# Patient Record
Sex: Male | Born: 1960 | Race: White | Hispanic: No | Marital: Married | Smoking: Former smoker
Health system: Southern US, Community
[De-identification: ages and names within clinical notes are randomized; demographics above are authoritative.]

## PROBLEM LIST (undated history)

## (undated) VITALS — BP 120/78 | HR 105 | Ht 69.0 in | Wt 208.4 lb

## (undated) DIAGNOSIS — I493 Ventricular premature depolarization: Secondary | ICD-10-CM

## (undated) DIAGNOSIS — M5030 Other cervical disc degeneration, unspecified cervical region: Secondary | ICD-10-CM

## (undated) DIAGNOSIS — G4733 Obstructive sleep apnea (adult) (pediatric): Secondary | ICD-10-CM

## (undated) DIAGNOSIS — R0602 Shortness of breath: Secondary | ICD-10-CM

## (undated) DIAGNOSIS — E119 Type 2 diabetes mellitus without complications: Secondary | ICD-10-CM

## (undated) DIAGNOSIS — E785 Hyperlipidemia, unspecified: Secondary | ICD-10-CM

## (undated) DIAGNOSIS — G5600 Carpal tunnel syndrome, unspecified upper limb: Secondary | ICD-10-CM

## (undated) DIAGNOSIS — R079 Chest pain, unspecified: Secondary | ICD-10-CM

## (undated) HISTORY — DX: Obstructive sleep apnea (adult) (pediatric): G47.33

## (undated) HISTORY — DX: Ventricular premature depolarization: I49.3

## (undated) HISTORY — DX: Hyperlipidemia, unspecified: E78.5

## (undated) HISTORY — DX: Shortness of breath: R06.02

## (undated) HISTORY — DX: Chest pain, unspecified: R07.9

## (undated) HISTORY — DX: Type 2 diabetes mellitus without complications: E11.9

## (undated) HISTORY — DX: Carpal tunnel syndrome, unspecified upper limb: G56.00

## (undated) HISTORY — DX: Other cervical disc degeneration, unspecified cervical region: M50.30

---

## 2005-03-05 ENCOUNTER — Emergency Department (HOSPITAL_COMMUNITY): Admission: EM | Admit: 2005-03-05 | Discharge: 2005-03-05 | Payer: Self-pay | Admitting: Emergency Medicine

## 2005-03-21 ENCOUNTER — Ambulatory Visit (HOSPITAL_COMMUNITY): Admission: RE | Admit: 2005-03-21 | Discharge: 2005-03-21 | Payer: Self-pay | Admitting: Urology

## 2005-03-26 ENCOUNTER — Encounter (INDEPENDENT_AMBULATORY_CARE_PROVIDER_SITE_OTHER): Payer: Self-pay | Admitting: Specialist

## 2005-03-26 ENCOUNTER — Ambulatory Visit (HOSPITAL_COMMUNITY): Admission: RE | Admit: 2005-03-26 | Discharge: 2005-03-26 | Payer: Self-pay | Admitting: Urology

## 2006-03-27 HISTORY — PX: HERNIA REPAIR: SHX51

## 2006-06-11 ENCOUNTER — Ambulatory Visit (HOSPITAL_BASED_OUTPATIENT_CLINIC_OR_DEPARTMENT_OTHER): Admission: RE | Admit: 2006-06-11 | Discharge: 2006-06-11 | Payer: Self-pay | Admitting: Family Medicine

## 2006-06-14 ENCOUNTER — Ambulatory Visit: Payer: Self-pay | Admitting: Internal Medicine

## 2006-11-25 HISTORY — PX: NECK SURGERY: SHX720

## 2007-09-16 ENCOUNTER — Ambulatory Visit: Payer: Self-pay | Admitting: Internal Medicine

## 2007-09-22 ENCOUNTER — Ambulatory Visit: Payer: Self-pay | Admitting: Internal Medicine

## 2007-09-22 ENCOUNTER — Encounter: Payer: Self-pay | Admitting: Internal Medicine

## 2008-02-02 ENCOUNTER — Ambulatory Visit (HOSPITAL_COMMUNITY): Admission: RE | Admit: 2008-02-02 | Discharge: 2008-02-03 | Payer: Self-pay | Admitting: Neurosurgery

## 2009-07-27 ENCOUNTER — Emergency Department (HOSPITAL_COMMUNITY): Admission: EM | Admit: 2009-07-27 | Discharge: 2009-07-27 | Payer: Self-pay | Admitting: Emergency Medicine

## 2009-07-27 DIAGNOSIS — G5600 Carpal tunnel syndrome, unspecified upper limb: Secondary | ICD-10-CM | POA: Insufficient documentation

## 2009-07-27 DIAGNOSIS — R05 Cough: Secondary | ICD-10-CM

## 2009-07-27 DIAGNOSIS — M5030 Other cervical disc degeneration, unspecified cervical region: Secondary | ICD-10-CM | POA: Insufficient documentation

## 2009-07-27 DIAGNOSIS — G4733 Obstructive sleep apnea (adult) (pediatric): Secondary | ICD-10-CM | POA: Insufficient documentation

## 2009-07-27 DIAGNOSIS — J209 Acute bronchitis, unspecified: Secondary | ICD-10-CM | POA: Insufficient documentation

## 2009-07-27 DIAGNOSIS — R059 Cough, unspecified: Secondary | ICD-10-CM | POA: Insufficient documentation

## 2009-07-30 ENCOUNTER — Ambulatory Visit: Payer: Self-pay | Admitting: Pulmonary Disease

## 2009-07-30 DIAGNOSIS — J440 Chronic obstructive pulmonary disease with acute lower respiratory infection: Secondary | ICD-10-CM | POA: Insufficient documentation

## 2009-08-23 ENCOUNTER — Ambulatory Visit: Payer: Self-pay | Admitting: Pulmonary Disease

## 2009-09-20 ENCOUNTER — Encounter: Admission: RE | Admit: 2009-09-20 | Discharge: 2009-09-20 | Payer: Self-pay | Admitting: Neurosurgery

## 2009-10-16 ENCOUNTER — Ambulatory Visit (HOSPITAL_COMMUNITY): Admission: RE | Admit: 2009-10-16 | Discharge: 2009-10-16 | Payer: Self-pay | Admitting: Neurosurgery

## 2009-10-25 HISTORY — PX: BACK SURGERY: SHX140

## 2009-11-26 IMAGING — CR DG CERVICAL SPINE 2 OR 3 VIEWS
1 series · 1 of 1 positions shown · non-contrast
Comparison: None

CLINICAL DATA: C4-C6 fusion

CERVICAL SPINE - 2-3 VIEW

[view not recorded]
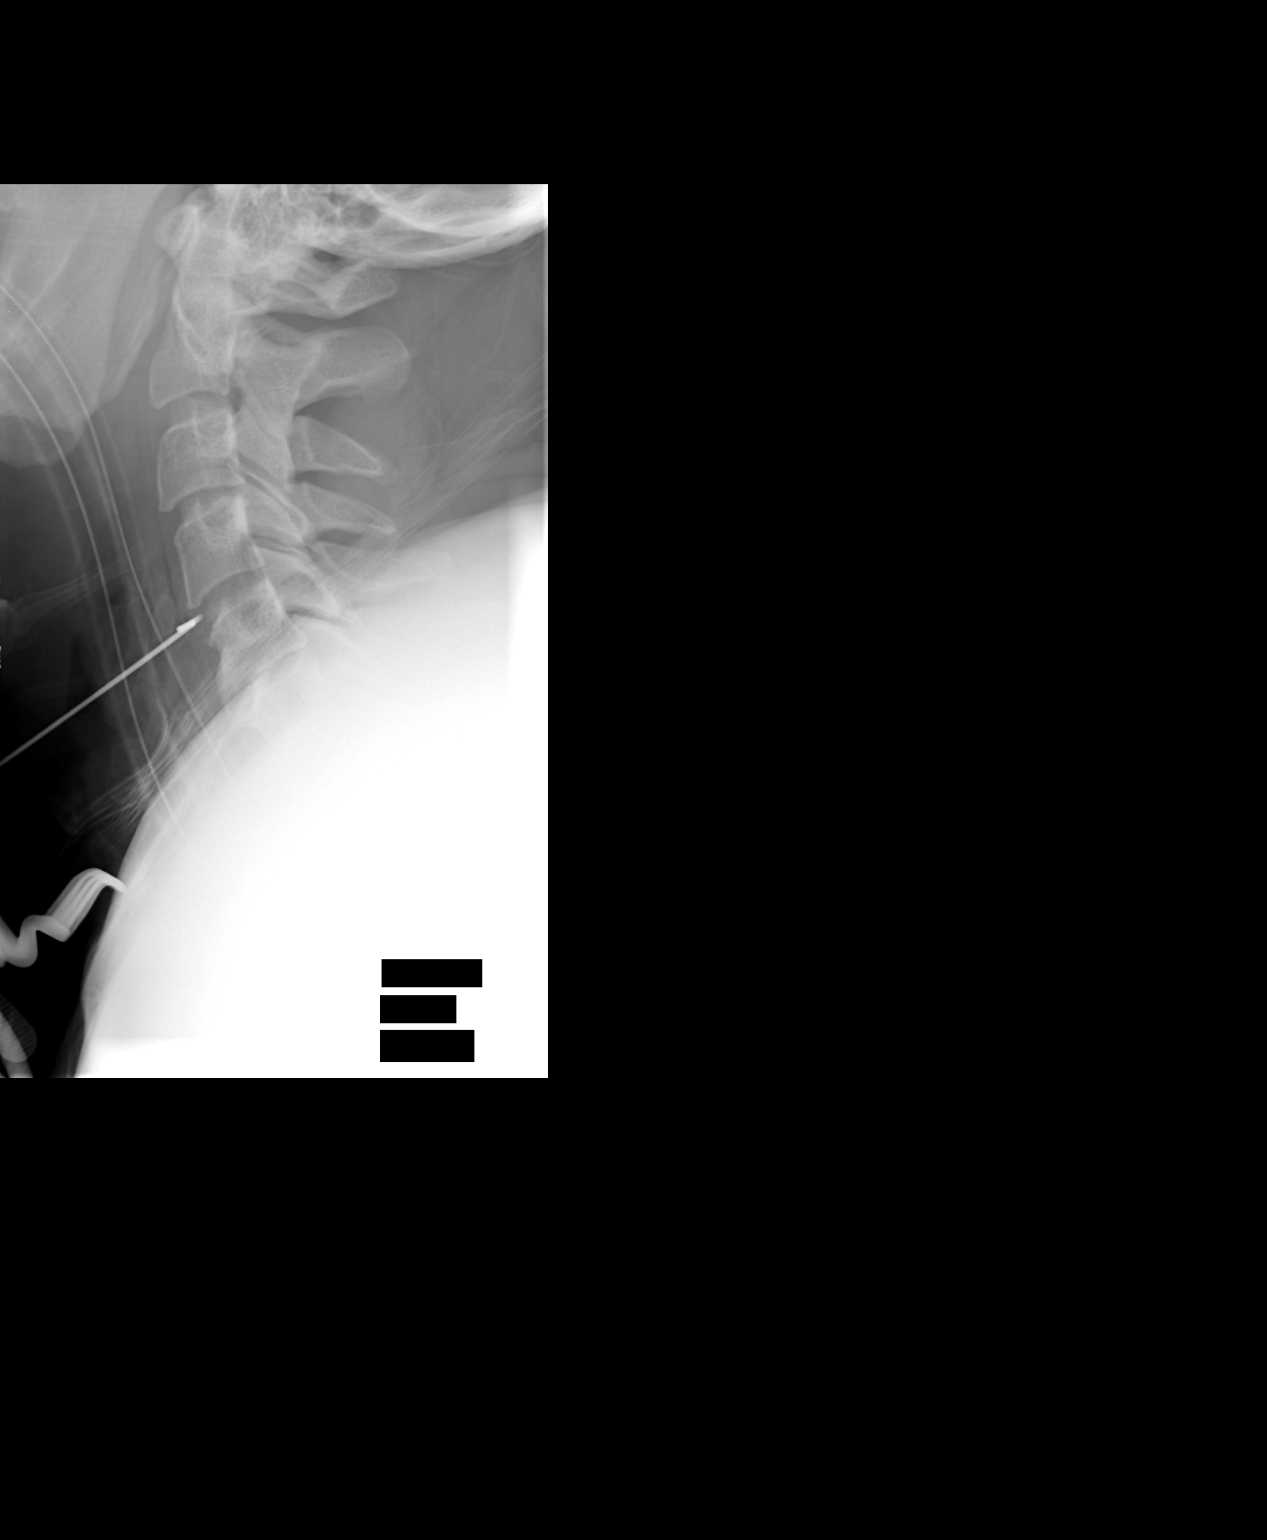

[1 of 1 positions shown; findings below may reference images not displayed]

FINDINGS: An initial film shows a needle at the anterior C4-5 disc
space.  The second film shows anterior cervical discectomy and
fusion from C4-C6.  Interbody fusion material is in place.  There
is an anterior plate with screw fixation.  Sponges are in place
along the operative approach.
IMPRESSION: ACDF C4-C6

## 2009-11-26 IMAGING — CR DG CHEST 2V
2 series · 2 of 2 positions shown · non-contrast
Comparison: None

CLINICAL DATA: Preop.  Cough and bronchitis.

CHEST - 2 VIEW

[view not recorded (1 of 2)]
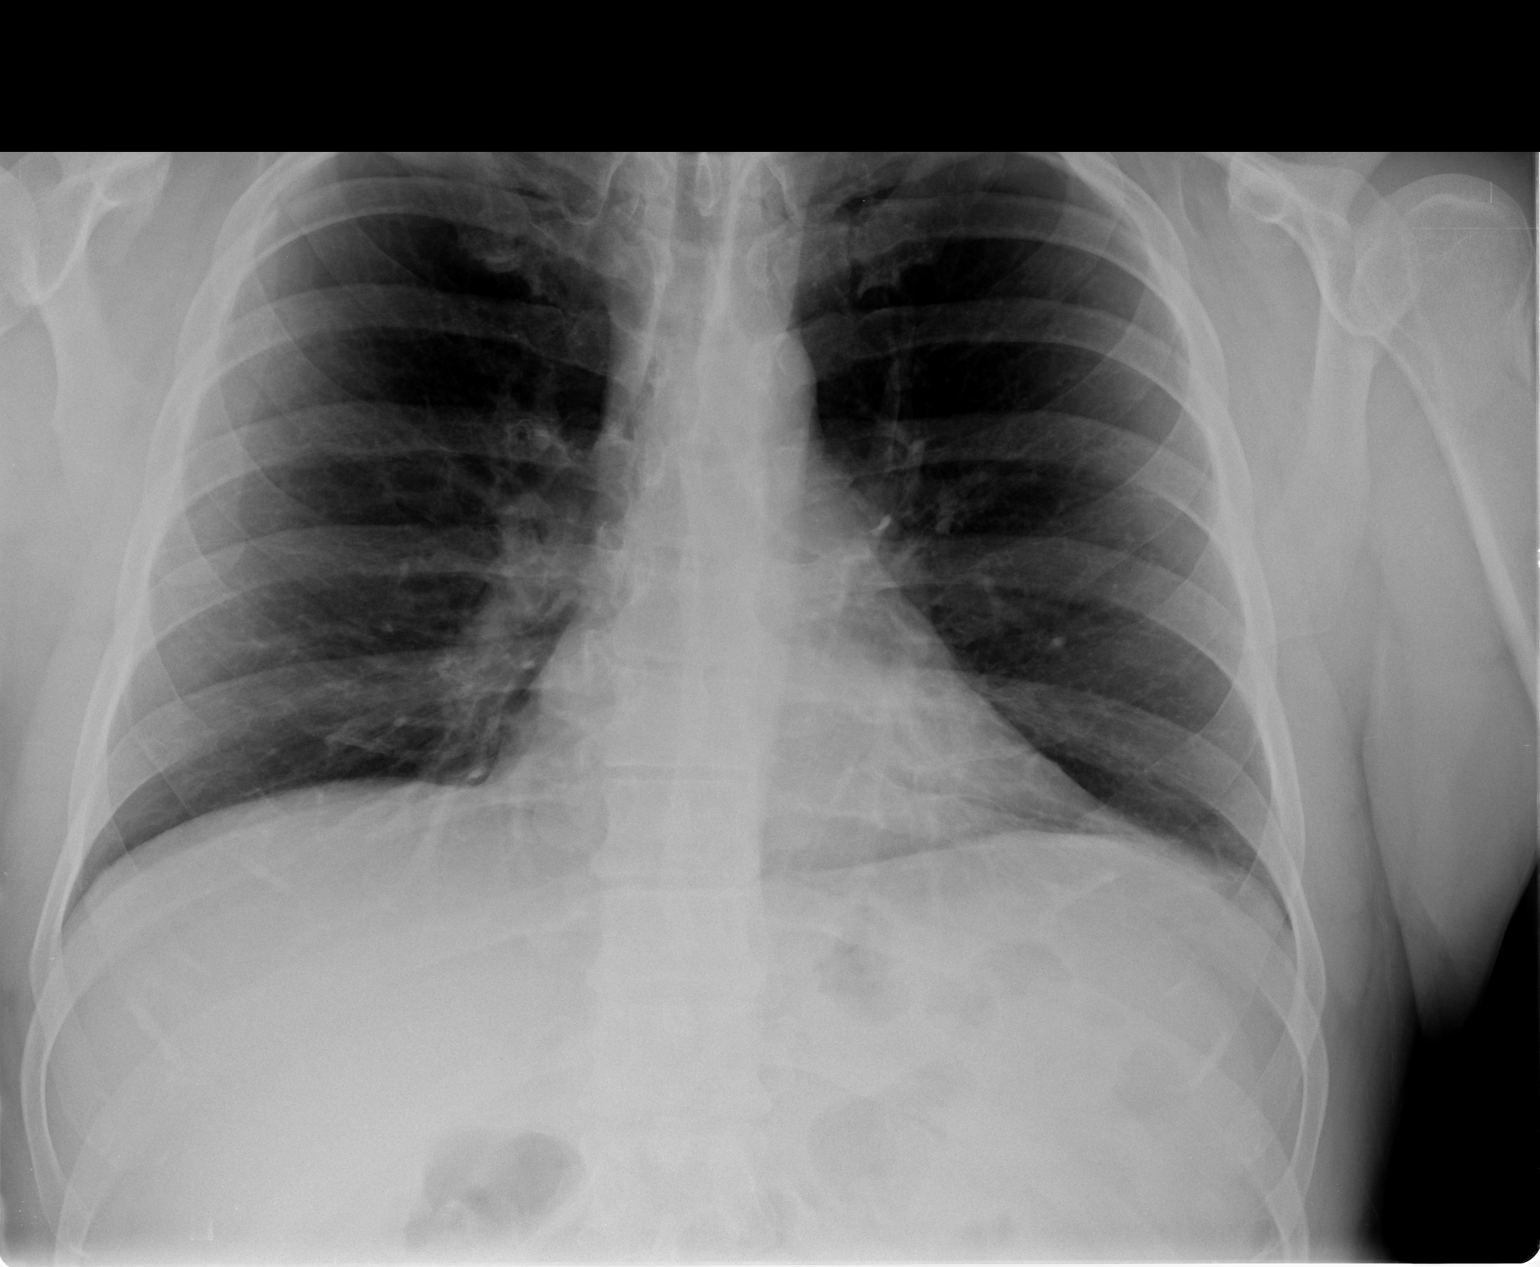

[view not recorded (2 of 2)]
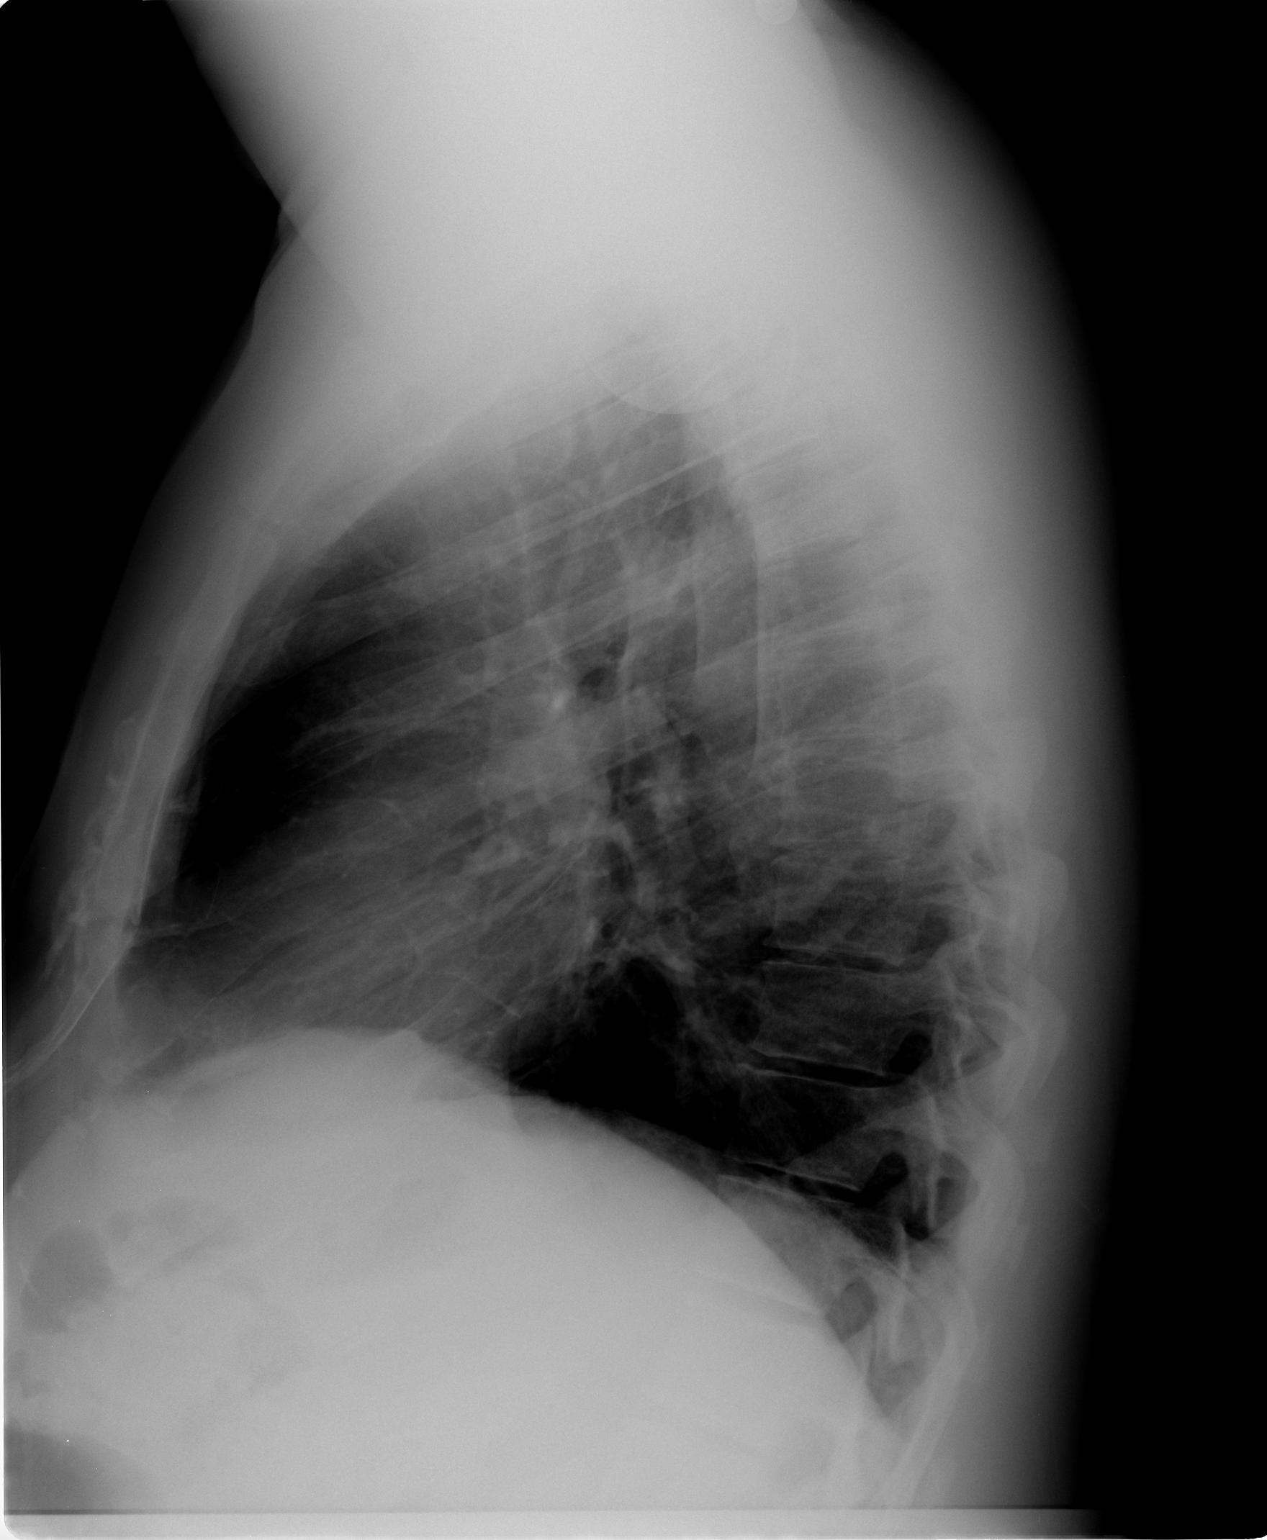

[2 of 2 positions shown; findings below may reference images not displayed]

FINDINGS: Trachea is midline.  Heart size normal.  Lungs are clear.
No pleural fluid.
IMPRESSION: No acute findings.

## 2010-02-21 ENCOUNTER — Ambulatory Visit: Payer: Self-pay | Admitting: Pulmonary Disease

## 2010-02-21 DIAGNOSIS — J4489 Other specified chronic obstructive pulmonary disease: Secondary | ICD-10-CM

## 2010-03-26 NOTE — Assessment & Plan Note (Signed)
Summary: consult for recurrent "bronchitis"   Copy to:  Herb Grays Primary Provider/Referring Provider:  Herb Grays  CC:  Pulmonary Consult.  History of Present Illness: the patient is a 50 year old male who I've been asked to see for shortness of breath and cough. The patient states that he has had 3 episodes of "bronchitis" since January of this year. He describes this as worsening cough, 40% of the time dry and 60% of the time with scant mucus that is occasionally discolored. He only has some increasing shortness of breath during these episodes. The patient has been treated with antibiotics as well as steroids, but his symptoms usually come back after stopping the antibiotics. He had a recent chest x-ray the beginning of June that was normal. His last episode was one week ago when he went to the emergency room, and was treated with prednisone and a few nebulizer treatments. He is currently on a prednisone taper, with improvement, and is nearing his usual baseline. He also uses a Ventolin inhaler as needed. The patient has a history of smoking less than one pack per day since age 89, and stopped smoking 2 months ago. He denies any recent issues with reflux, but does have postnasal drip. He denies any sinus pressure, but does have mucus from his nose at times that varies from clear to brown. He has a history of a positive PPD in 2009, but has noted no obvious exposure to tuberculosis in the past.  Current Medications (verified): 1)  Proventil Hfa 108 (90 Base) Mcg/act Aers (Albuterol Sulfate) .... 2 Puss Q4-6 Hours As Needed Sob/wheeze 2)  Benzonatate 200 Mg Caps (Benzonatate) .... Take 1 Tablet By Mouth Three Times A Day 3)  Levaquin 750 Mg Tabs (Levofloxacin) .... Take 1 Tablet By Mouth Once A Day 4)  Prednisone 20 Mg Tabs (Prednisone) .... Take 1 Tablet By Mouth Once A Day  Allergies: 1)  ! Celexa 2)  ! * Hydrocodone Liquid Form  Past History:  Past Medical History: h/o  +ppd OBSTRUCTIVE SLEEP APNEA (ICD-327.23) CARPAL TUNNEL SYNDROME, BILATERAL (ICD-354.0) DEGENERATIVE DISC DISEASE, CERVICAL SPINE (ICD-722.4)  Past Surgical History: neck surgery Dec 2009 hernia surgery 2007  Family History: Reviewed history from 07/27/2009 and no changes required. Heart Disease: father cancer: mother (pancreatic), maternal grandmother (lung)  Social History: Reviewed history from 07/27/2009 and no changes required. smoked < 1ppd since age 62, quit in April Pt is married. pt has children. 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  The patient denies coughing up blood, chest pain, irregular heartbeats, acid heartburn, indigestion, loss of appetite, weight change, abdominal pain, difficulty swallowing, sore throat, tooth/dental problems, headaches, sneezing, itching, ear ache, anxiety, hand/feet swelling, joint stiffness or pain, rash, change in color of mucus, and fever.    Vital Signs:  Patient profile:   50 year old male Height:      69 inches Weight:      205 pounds BMI:     30.38 O2 Sat:      95 % on Room air Temp:     98.1 degrees F oral Pulse rate:   102 / minute BP sitting:   126 / 88  (left arm) Cuff size:   regular  Vitals Entered By: Arman Filter LPN (July 31, 8411  2:44 PM)  O2 Flow:  Room air CC: Pulmonary Consult Comments Medications reviewed with patient Arman Filter LPN  July 31, 4538 2:54 PM    Physical Exam  General:  ow male in nad Eyes:  PERRLA and EOMI.   Nose:  patent without discharge no purulence noted. Mouth:  clear Neck:  no jvd, tmg, LN Lungs:  fairly clear, no wheezing, a few rhonchi in upper lung zones. Heart:  rrr, no mrg Abdomen:  soft and nontender, bs+ Extremities:  no edema noted,  no cyanosis pulses intact distally Neurologic:  alert and oriented, moves all 4.   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  I would hope, now that he has quit, the episodes will resolve.  His spirometry today is not overly impressive for airway obstruction,  but there are some subtle findings that I suspect are related to airway inflammation.  I do not think he has emphysema, and would like to treat with LABA/ICS combination while he is working thru his smoking cessation.  He should be able to come off the medication once he is further out from these episodes and his smoking.  The other possible etiologies for his symptoms include chronic sinus disease and LPR, and will keep these in mind if he fails to improve with the above plan.  Medications Added to Medication List This Visit: 1)  Benzonatate 200 Mg Caps (Benzonatate) .... Take 1 tablet by mouth three times a day 2)  Levaquin 750 Mg Tabs (Levofloxacin) .... Take 1 tablet by mouth once a day 3)  Prednisone 20 Mg Tabs (Prednisone) .... Take 1 tablet by mouth once a day  Other Orders: Consultation Level IV (98119) Spirometry w/Graph (14782)  Patient Instructions: 1)  symbicort 160/4.5 2 inhalations am and pm everyday whether you need or not.  Rinse mouth well. 2)  can use ventolin for rescue 3)  finish up prednisone taper as directed on prescription. 4)  followup with me in 3 weeks, but call if not doing well. 5)  stay off cigarettes.   [facility] Spirometry  ID: [id number] Patient: Scott Mccarty, Scott Mccarty DOB: 1960-06-21 Age: 50 Years Old Sex: Male Race: White Physician: Raynesha Tiedt Height: 69 Weight: 205 Status: Confirmed Past Medical History:  BRONCHITIS, ACUTE (ICD-466.0) COUGH (ICD-786.2) OBSTRUCTIVE SLEEP APNEA (ICD-327.23) CARPAL  TUNNEL SYNDROME, BILATERAL (ICD-354.0) DEGENERATIVE DISC DISEASE, CERVICAL SPINE (ICD-722.4)   Recorded: 07/30/2009 4:01 PM  Parameter  Measured Predicted %Predicted FVC     3.46        4.88        71.10 FEV1     2.57        3.79        67.70 FEV1%   74.04        77.74        95.30 PEF    4.28        9.58        44.60   Interpretation: the pt's FEV1% is normal, but the FEV1 is suggestive of obstruction.

## 2010-03-26 NOTE — Assessment & Plan Note (Signed)
Summary: rov for airway inflammation due to smoiking   Copy to:  Herb Grays Primary Provider/Referring Provider:  Herb Grays  CC:  Pt is here for a 3 week f/u appt.    Pt states he is feeling "much better"  compared to previous visit.   Pt states he still smokes approx 1 cig every few days.   Pt states breathing has improved.  Pt denied a cough.   Pt states occ PND. Marland Kitchen  History of Present Illness: The pt comes in today for f/u of his ongoing airway inflammation related to tobacco abuse.  At the last visit, he was started on symbicort and asked to totally stop smoking.  He is much improved, and although has not quit, is down to smoking one cig a day.  He has no cough or chest congestion.  His breathing is much better.  Preventive Screening-Counseling & Management  Alcohol-Tobacco     Smoking Status: current     Smoking Cessation Counseling: yes     Packs/Day: <0.25     Tobacco Counseling: to quit use of tobacco products  Current Medications (verified): 1)  Proventil Hfa 108 (90 Base) Mcg/act Aers (Albuterol Sulfate) .... 2 Puss Q4-6 Hours As Needed Sob/wheeze 2)  Symbicort 160-4.5 Mcg/act  Aero (Budesonide-Formoterol Fumarate) .... Two Puffs Twice Daily  Allergies (verified): 1)  ! Celexa 2)  ! * Hydrocodone Liquid Form  Social History: Packs/Day:  <0.25  Review of Systems  The patient denies shortness of breath with activity, shortness of breath at rest, productive cough, non-productive cough, coughing up blood, chest pain, irregular heartbeats, acid heartburn, indigestion, loss of appetite, weight change, abdominal pain, difficulty swallowing, sore throat, tooth/dental problems, headaches, nasal congestion/difficulty breathing through nose, sneezing, itching, ear ache, anxiety, depression, hand/feet swelling, joint stiffness or pain, rash, change in color of mucus, and fever.    Vital Signs:  Patient profile:   50 year old male Height:      69 inches Weight:      210  pounds BMI:     31.12 O2 Sat:      95 % on Room air Temp:     98.6 degrees F oral Pulse rate:   104 / minute BP sitting:   122 / 60  (left arm)  Vitals Entered By: Arman Filter LPN (August 23, 2009 2:53 PM)  O2 Flow:  Room air CC: Pt is here for a 3 week f/u appt.    Pt states he is feeling "much better"  compared to previous visit.   Pt states he still smokes approx 1 cig every few days.   Pt states breathing has improved.  Pt denied a cough.   Pt states occ PND.  Comments Medications reviewed with patient  Arman Filter LPN  August 23, 2009 2:54 PM    Physical Exam  General:  ow male in nad Nose:  no discharge noted. Lungs:  clear to auscultation no wheezing or rhonchi Heart:  rrr, no mrg Extremities:  no edema or cyanosis  Neurologic:  alert and oriented, moves all 4.   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  I suspect once he quits smoking, he will need no maintenance meds.  I have asked him to stay on symbicort for a few more months, but can d/c if he totally quits smoking.  He will followup with me in  6mos if still smoking and having ongoing issues.  Medications Added to Medication List This Visit: 1)  Symbicort 160-4.5 Mcg/act Aero (Budesonide-formoterol fumarate) .... Two puffs twice daily  Other Orders: Est. Patient Level III (95284) Tobacco use cessation intermediate 3-10 minutes (13244)  Patient Instructions: 1)  stay on symbicort for a few months after you TOTALLY quit smoking.  If doing well, can come off the medication. 2)  followup with me in 6mos.  If off the medications and doing well, can call and cancel.   Prescriptions: SYMBICORT 160-4.5 MCG/ACT  AERO (BUDESONIDE-FORMOTEROL FUMARATE) Two puffs twice daily  #1 x 6   Entered and Authorized by:   Barbaraann Share MD   Signed by:   Barbaraann Share MD on 08/23/2009    Method used:   Print then Give to Patient   RxID:   [id number] PROVENTIL HFA 108 (90 BASE) MCG/ACT AERS (ALBUTEROL SULFATE) 2 puss q4-6 hours as needed SOB/wheeze  #1 x 6   Entered and Authorized by:   Barbaraann Share MD   Signed by:   Barbaraann Share MD on 08/23/2009   Method used:   Print then Give to Patient   RxID:   [id number]

## 2010-03-28 NOTE — Assessment & Plan Note (Signed)
Summary: rov for cough and congestion   Copy to:  Herb Grays Primary Provider/Referring Provider:  Herb Grays  CC:  6 month f/u appt. currently smoking 1/2 ppd.  states he has had bronchitis x 3 over last 6 months and was treated by pcp. Marland Kitchen  History of Present Illness: the pt comes in today for f/u of his recurrent bronchitis secondary to airway inflammation from ongoing smoking.  He has unfortunately continued to smoke, and has had repeated episodes since his last visit of acute asthmatic bronchitis despite using symbicort.  He currently has mild congestion but no purulence noted.  Preventive Screening-Counseling & Management  Alcohol-Tobacco     Smoking Status: current     Smoking Cessation Counseling: yes     Packs/Day: 0.5     Tobacco Counseling: to quit use of tobacco products  Medications Prior to Update: 1)  Proventil Hfa 108 (90 Base) Mcg/act Aers (Albuterol Sulfate) .... 2 Puss Q4-6 Hours As Needed Sob/wheeze 2)  Symbicort 160-4.5 Mcg/act  Aero (Budesonide-Formoterol Fumarate) .... Two Puffs Twice Daily  Allergies (verified): 1)  ! Celexa 2)  ! * Hydrocodone Liquid Form  Social History: smoked < 1ppd since age 59, currently smoking 1/2 ppd Pt is married. pt has children. 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  The patient denies shortness of breath with activity, non-productive cough, coughing up blood, chest pain, irregular heartbeats, indigestion, loss of appetite, weight change, abdominal pain, difficulty swallowing, sore throat, tooth/dental problems, headaches, itching, ear ache, anxiety, depression, hand/feet swelling, joint stiffness or pain, rash, change in color of mucus, and fever.    Vital Signs:  Patient profile:   50 year old male Height:      69  inches Weight:      208.38 pounds BMI:     30.88 O2 Sat:      97 % on Room air Temp:     97.9 degrees F oral Pulse rate:   105 / minute BP sitting:   120 / 78  (left arm) Cuff size:   regular  Vitals Entered By: Arman Filter LPN (February 21, 2010 3:05 PM)  O2 Flow:  Room air CC: 6 month f/u appt. currently smoking 1/2 ppd.  states he has had bronchitis x 3 over last 6 months and was treated by pcp.  Comments Medications reviewed with patient Arman Filter LPN  February 21, 2010 3:05 PM    Physical Exam  General:  ow male in nad Lungs:  one isolated wheeze left base Heart:  rrr, no mrg Extremities:  no edema or cyanosis  Neurologic:  alert and oriented, moves all 4.   Impression & Recommendations:  Problem # 1:  COUGH (ICD-786.2) the pt has ongoing cough and congestion, along with recurrent episodes of acute asthmatic bronchitis despite staying on symbicort.  I have explained to him again this will be a recurring theme until he totally stops smoking.  There is really nothing more to do for him at this point, except to help with smoking cessation.  We have discussed the possibility of chantix again.  Other Orders: Est. Patient Level III (42706) Tobacco use cessation intermediate 3-10 minutes (23762)  Patient Instructions: 1)  stop smoking..think about chantix. 2)  stay on symbicort with albuterol for rescue 3)  followup with  me in 6mos.   Not Administered:    Influenza Vaccine not given due to: declined

## 2010-07-09 NOTE — Letter (Signed)
September 16, 2007    Tammy R. Collins [place], M.D.  [address], [place] [address].   RE:  Scott, Mccarty  MRN:  [id number]  /  DOB:  1960/10/16   Dear Babette Relic,   Thank you for referring [name] for EP evaluation.  As you know,  he is a very pleasant 50 year old man who has a history of tachy  palpitations which have occurred most recently at nighttime.  The  patient has never had frank syncope.  He states that these episodes last  for upto a minute at a time and are more common at nighttime.  The  patient states that he has had some problems in the past with depression  and has recently undergone increased stress as he has had trouble with  his job at work and also noted that his stepdaughter who is 30 has moved  back in with he and his wife along with her 3-year-old son.  The patient  recently was started back on the combination of Zoloft and Wellbutrin  and notes that since this was accomplished, his symptoms have improved  and overall he feels better.  He does note some dyspnea with exertion,  but is able to work on a regular basis without particular difficulty.  He denies frank syncope.   PAST MEDICAL HISTORY:  Notable for a history of depression, history of  constipation, he had a history of gastroesophageal reflux problems, and  he denies any recent surgeries.   SOCIAL HISTORY:  The patient is married.  He works in [employer] as a Therapist, nutritional.  He has a history of tobacco use, but stopped in 2004.  He  drinks 2-3 alcoholic beverages per day, may drink as many as 48 ounces  of caffeinated beverages per day.   REVIEW OF SYSTEMS:  Notable for some seasonal allergic rhinitis,  occasional problems with fatigue, constipation, and dysuria.  Otherwise  all systems reviewed are negative, except as noted above in the HPI.   MEDICATIONS:  1. Sertraline 100 mg a day.  2. Doxycycline 100 mg a day.   PHYSICAL EXAMINATION:  GENERAL:  He is a pleasant middle-aged man in no  acute distress.  VITAL SIGNS:  Blood pressure today was 131/85, the pulse was 77 and  regular, and the respirations were 18.  Weight was 220 pounds.  HEENT:  Normocephalic and atraumatic.  The patient does wear glasses for  visual acuity.  The oropharynx is moist.  Sclerae are anicteric.  NECK:  No jugular vein distention.  There is no thyromegaly.  LUNGS:  Clear bilaterally to auscultation.  No wheezes, rales, or  rhonchi are present.  There is no increased work of breathing.  CARDIOVASCULAR:  Regular rate and rhythm.  Normal S1 and S2.  PMI was  not enlarged nor was it laterally displaced.  There is no obvious  murmurs, rubs, or gallops.  ABDOMEN:  Obese, nontender, and nondistended.  There is no organomegaly.  The bowel sounds are present.  No rebound or guarding.  EXTREMITIES:  No cyanosis, clubbing, or edema.  The pulses were 2+ and  symmetric.  NEUROLOGIC:  Alert and oriented x3.  Cranial nerves intact.  Strength  was 5/5 and symmetric.   The EKG demonstrates sinus rhythm with normal axis and intervals.   Review of the patient's Holter monitor demonstrates sinus tachycardia.  There was a period of what looks like noise artifact, though I cannot  definitively exclude runs of nonsustained VT.  I am fairly certain that  these do in fact represent noise   IMPRESSION:  I think Scott Mccarty has palpitations which are probably  premature ventricular contraction or premature atrial contraction, but  since he was started back on his depression/anxiety medicines, he has  actually overall improved.  I think some of this is situational in that  he has undergone increased stress with job changes and family dynamic  changes.  To rule out any concerning problems, I have recommended he  undergo 2-D echo, and I have scheduled this to be accomplished in the  next week or so.  If his echo is okay, then I  would recommend a period of watchful waiting with no additional  evaluation unless his  symptoms worsen.  I have reassured the patient  with regard to this the following plan.   Thanks again for referring Scott Mccarty for EP evaluation.    Sincerely,      Doylene Canning. Ladona Ridgel, MD  Electronically Signed    GWT/MedQ  DD: 09/16/2007  DT: 09/17/2007  Job #: [id number]

## 2010-07-09 NOTE — Op Note (Signed)
NAMEAMANTE, FOMBY NO.:  [id number]   MEDICAL RECORD NO.:  [id number]          PATIENT TYPE:  OIB   LOCATION:  3536                         FACILITY:  [facility]   PHYSICIAN:  Cristi Loron, M.D.DATE OF BIRTH:  1960-05-14   DATE OF PROCEDURE:  02/02/2008  DATE OF DISCHARGE:                               OPERATIVE REPORT   BRIEF HISTORY:  The patient is a 50 year old white male who has suffered  from arm and hand numbness.  He failed medical management worked up with  a cervical MRI demonstrated the patient had multilevel degenerative  change with stenosis what appeared most prominent at C5-6 and C4-5.  I  discussed the various treatment options with the patient including  surgery.  He has weighed the risks, benefits, and alternatives of  surgery, and decided to proceed with C4-5 and C5-6 anterior cervical  diskectomy fusion plating.   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   PROCEDURE:  C4-5 and C5-6 extensive anterior cervical  diskectomy/decompression; C4-5 and C5-6 anterior interbody arthrodesis  with local morselized autograft bone, and Actifuse bone graft extender;  insertion of C4-5 and C5-6 interbody prosthesis (Nobel PEEK interbody  prosthesis); anterior cervical plating from C4 to C6 with Codman Slim-  Loc anterior plate and screws.   SURGEON:  Cristi Loron, MD   ASSISTANT:  Payton Doughty, MD   ANESTHESIA:  General endotracheal.   ESTIMATED BLOOD LOSS:  100 mL.   SPECIMENS:  None.   DRAINS:  None.   COMPLICATIONS:  None.   DESCRIPTION OF PROCEDURE:  The patient was brought to the operating room  by the anesthesia team.  General endotracheal anesthesia was induced.  The patient remained in supine position.  A roll was  placed under  shoulders and placed his neck in slight extension.  His anterior  cervical region was then prepared with Betadine scrub with Betadine  solution.  Sterile drapes were applied.  I then injected the area to be  incised with Marcaine with epinephrine solution.  I used a scalpel to  make a transverse incision in the patient's left anterior neck.  I used  a Metzenbaum scissor to divide the platysma muscle and then to dissect  medial to sternocleidomastoid muscle, jugular vein, and carotid artery.  I then carefully dissected down towards the anterior cervical spine  identifying the esophagus and retracting it medially.  We used a skin  swab to clear the soft tissue from the anterior surface of the cervical  spine.  We then inserted a bent spinal needle into the upper exposed  intervertebral disk space.  We obtained intraoperative radiograph to  confirm our location.   We then used electrocautery to detach the medial border of the longus  colli muscle bilaterally from C4-5, C5-6 interspaces.  We inserted the  Caspar self-retaining retractor underneath the longus colli muscle  bilaterally to provide exposure.  We then we began the decompression at  C5-6.  We  incised the C5-6 intervertebral disk with a 15-blade scalpel  and performed a partial intervertebral diskectomy with pituitary forceps  and Carlens curettes.  This space was quite spondylotic.  We then  inserted distraction screws at C5 and C6 distracted interspace.  We then  used a high-speed drill to decorticate the vertebral endplates, drill  away the remainder of this C4-5 intervertebral disk, and to thin out the  posterior longitudinal ligament.  We then incised the thinned out  ligament with arachnoid knife and removed with Kerrison punch  undercutting the vertebral endplates at C5-6.  We then performed  foraminotomies about the bilateral C6 nerve root completing the  decompression at this level.   We then repeated the  procedure in analogous fashion at C4-5  decompressing the thecal sac and bilateral C5 nerve roots, this  completed the decompression.   We now turned attention to the arthrodesis.  We used the trial spacers  and determined to use a 6-mm medium interbody prosthesis.  We prefilled  this prosthesis with combination of local autograft bone.  We obtained  during the decompression as well as Actifuse bone graft extender.  We  inserted the prosthesis into the distracted interspaces and then removed  distraction screws.  There was a good snug fit of the prosthesis at both  levels.   We now turned attention to the anterior spinal instrumentation.  We  selected the appropriate length Codman Slim-Loc anterior cervical plate.  We laid along the anterior aspect of the vertebra from C4 to C6.  We  then drilled two 40-mm holes at C4, C5, and C6.  We then secured the  plate to the vertebral bodies by placing two 40-mm self-tapping screws  at C4, C5, and C6.  We then obtained intraoperative radiograph that  demonstrated good position of plates, screws, and body prosthesis.  We,  therefore, secured these screws to plate by locking each cam.   We then obtained hemostasis using bipolar electrocautery.  We irrigated  the wound out with bacitracin solution.  We then removed the retractors  and inspected the esophagus for any damage; there was none apparent.  We  then reapproximated the patient's platysma muscle with interrupted 3-0  Vicryl suture, the subcutaneous tissue with interrupted 3-0 Vicryl  suture, and the skin with Steri-Strips and Benzoin.  The wound was then  coated with bacitracin ointment.  Sterile dressing was applied.  The  drapes were removed, and the patient was subsequently extubated by the  anesthesia team and transported to Postanesthesia Care Unit in stable  condition.  All sponge, instrument, and needle counts were correct in  this case.      Cristi Loron, M.D.   Electronically Signed     JDJ/MEDQ  D:  02/03/2008  T:  02/03/2008  Job:  [id number]

## 2010-07-12 NOTE — Procedures (Signed)
NAMEISHMEL, ACEVEDO                 ACCOUNT NO.:  [id number]   MEDICAL RECORD NO.:  [id number]          PATIENT TYPE:  OUT   LOCATION:  SLEEP CENTER                 FACILITY:  [facility]   PHYSICIAN:  Clinton D. Maple Hudson, MD, FCCP, FACPDATE OF BIRTH:  27-Dec-1960   DATE OF STUDY:                            NOCTURNAL POLYSOMNOGRAM   REFERRING PHYSICIAN:   Audio too short to transcribe (less than 5 seconds)      Clinton D. Maple Hudson, MD, Tonny Bollman, FACP  Diplomate, American Board of Sleep Medicine     CDY/MEDQ  D:  06/14/2006 14:20:27  T:  06/14/2006 14:21:24  Job:  [id number]

## 2010-07-12 NOTE — Procedures (Signed)
[name], Scott Mccarty                 ACCOUNT NO.:  [id number]   MEDICAL RECORD NO.:  [id number]          PATIENT TYPE:  OUT   LOCATION:  SLEEP CENTER                 FACILITY:  [facility]   PHYSICIAN:  Clinton D. Maple Hudson, MD, FCCP, FACPDATE OF BIRTH:  Feb 01, 1961   DATE OF STUDY:  06/11/2006                            NOCTURNAL POLYSOMNOGRAM   REFERRING PHYSICIAN:  Tammy R. Collins Scotland, M.D.   INDICATIONS FOR PROCEDURE:  Hypersomnia with sleep apnea.   RESULTS:  Epward sleepiness score 17/24. BMI 32.7. Weight 221 pounds.   MEDICATIONS:  Home medications listed and reviewed.   SLEEP ARCHITECTURE:  Total sleep time 344 minutes with sleep efficiency  87%. Stage 1 was 8%; Stage 2, 77%; Stages 3 and 4 absent. REM 15% of  total sleep time. Sleep latency 8 minutes, REM latency 255 minutes,  awake after sleep onset 45 minutes. Arousal index 21.8. No bedtime  medication was taken.   RESPIRATORY DATA:  Split study protocol. Apnea/hypopnea index (AHI, RDI)  24.2 obstructive events per hour, indicating moderate obstructive sleep  apnea/hypopnea syndrome before CPAP. There were 20 obstructive apnea's  and 36 hypopnea's before CPAP. Events were not positional. REM AHI 3.6.  CPAP was titrated to 17 CWP, AHI 1.5 per hour. A small ResMed Ultra  Mirage full face mask was used with heated humidifier.   OXYGEN DATA:  Moderate to loud snoring with oxygen desaturation to a  nadir of 79%. Mean oxygen saturation after CPAP control was 95% on room  air.   CARDIAC DATA:  Normal sinus rhythm.   MOVEMENT/PARASOMNIA:  Occasional limb jerk with arousal, insignificant.   IMPRESSION/RECOMMENDATIONS:  1. Moderate obstructive sleep apnea/hypopnea syndrome, AHI 24.2 per      hour with non-positional events, loud snoring, and oxygen      desaturation to a nadir of 79%.  2. Successful CPAP titration to 17 CWP, AHI 1.5 per hour. A small      ResMed Ultra Mirage full face mask was used with heated humidifier.      Clinton  D. Maple Hudson, MD, [facility], FACP  Diplomate, Biomedical engineer of Sleep Medicine  Electronically Signed     CDY/MEDQ  D:  06/14/2006 14:24:18  T:  06/14/2006 20:55:45  Job:  [id number]

## 2010-08-21 ENCOUNTER — Encounter: Payer: Self-pay | Admitting: Pulmonary Disease

## 2010-08-22 ENCOUNTER — Ambulatory Visit: Payer: Self-pay | Admitting: Pulmonary Disease

## 2010-09-12 ENCOUNTER — Ambulatory Visit (INDEPENDENT_AMBULATORY_CARE_PROVIDER_SITE_OTHER): Payer: BC Managed Care – PPO | Admitting: Pulmonary Disease

## 2010-09-12 ENCOUNTER — Ambulatory Visit (INDEPENDENT_AMBULATORY_CARE_PROVIDER_SITE_OTHER)
Admission: RE | Admit: 2010-09-12 | Discharge: 2010-09-12 | Disposition: A | Discharge status: Home / Self Care | Payer: BC Managed Care – PPO | Source: Ambulatory Visit | Attending: Pulmonary Disease | Admitting: Pulmonary Disease

## 2010-09-12 ENCOUNTER — Encounter: Payer: Self-pay | Admitting: Pulmonary Disease

## 2010-09-12 VITALS — BP 112/80 | HR 93 | Temp 97.9°F | Ht 69.0 in | Wt 215.0 lb

## 2010-09-12 DIAGNOSIS — R0989 Other specified symptoms and signs involving the circulatory and respiratory systems: Secondary | ICD-10-CM

## 2010-09-12 DIAGNOSIS — R0609 Other forms of dyspnea: Secondary | ICD-10-CM

## 2010-09-12 DIAGNOSIS — J449 Chronic obstructive pulmonary disease, unspecified: Secondary | ICD-10-CM | POA: Insufficient documentation

## 2010-09-12 DIAGNOSIS — J4489 Other specified chronic obstructive pulmonary disease: Secondary | ICD-10-CM | POA: Insufficient documentation

## 2010-09-12 DIAGNOSIS — R0600 Dyspnea, unspecified: Secondary | ICD-10-CM

## 2010-09-12 NOTE — Assessment & Plan Note (Signed)
The pt has quit smoking and has stayed compliant on symbicort.  Despite this, he is having gasping episodes at times at rest, along with orthopnea.  I will check a cxr and repeat his pft's, but if these are unremarkable, he will need cardiac evaluation.  This may just be the perception of dyspnea due to his his obesity or anxiety, but this is a diagnosis of exclusion.

## 2010-09-12 NOTE — Patient Instructions (Signed)
Will check cxr today, and call you with results. Will schedule for breathing studies, and will call to discuss.  Work on weight loss and conditioning.

## 2010-09-12 NOTE — Progress Notes (Signed)
  Subjective:    Patient ID: Scott Mccarty, male    DOB: June 24, 1960, 50 y.o.   MRN: [id number]  HPI The pt comes in today for f/u of his known cough with recurrent AB from smoking.  The good news is that he has quit smoking, and feels the cough and mucus has resolved.  He is staying on symbicort compliantly, and has not had a recent episode of AB.  However, he is concerned about significant sob that he cannot explain.  He states that he can get winded just sitting down watching tv, and states "I will suddenly gasp for breath".  He also has issues lying down at bedtime with sob, but then feels better when he sits up.  He has doe with moderate or greater activity as well.  He denies angina, LE edema, or pleuritic chest pain.   Review of Systems  Constitutional: Negative for fever and unexpected weight change.  HENT: Positive for rhinorrhea and postnasal drip. Negative for ear pain, nosebleeds, congestion, sore throat, sneezing, trouble swallowing, dental problem and sinus pressure.   Eyes: Negative for redness and itching.  Respiratory: Positive for cough, shortness of breath and wheezing. Negative for chest tightness.   Cardiovascular: Positive for palpitations. Negative for leg swelling.  Gastrointestinal: Negative for nausea and vomiting.  Genitourinary: Negative for dysuria.  Musculoskeletal: Negative for joint swelling.  Skin: Negative for rash.  Neurological: Negative for headaches.  Hematological: Does not bruise/bleed easily.  Psychiatric/Behavioral: Negative for dysphoric mood. The patient is not nervous/anxious.        Objective:   Physical Exam Ow male in nad Nares patent without obvious discharge, no purulence seen  Chest with clear bs, no wheezing Cor with rrr, no mrg LE without edema, no cyanosis Alert, oriented, moves all 4        Assessment & Plan:

## 2010-09-23 ENCOUNTER — Encounter: Payer: Self-pay | Admitting: *Deleted

## 2010-09-29 ENCOUNTER — Other Ambulatory Visit: Payer: Self-pay | Admitting: Pulmonary Disease

## 2010-09-30 ENCOUNTER — Telehealth: Payer: Self-pay | Admitting: Pulmonary Disease

## 2010-09-30 NOTE — Telephone Encounter (Signed)
i would reschedule the pfts for 2 weeks, to let him get back to his baseline. Please either take care of this, or get pcc to help Thanks.

## 2010-09-30 NOTE — Telephone Encounter (Signed)
Pt aware of results for CXR-states he has appointment Thursday 10-03-10 for PFT-pt ended up at Urgent Care this morning-was given antibiotic for bronchitis. Would like KC's recs to cancel or keep PFT appt.

## 2010-10-01 NOTE — Telephone Encounter (Signed)
Called spoke with patient, advised of KC's recs.  Pt okay with this and verbalized his understanding.  PFT rsc to 8.30.12 @ 0900 as pt needs thursdays and 8.23.12 tammy wilson is off.  Will sign off on message.

## 2010-10-24 ENCOUNTER — Ambulatory Visit (INDEPENDENT_AMBULATORY_CARE_PROVIDER_SITE_OTHER): Payer: BC Managed Care – PPO | Admitting: Pulmonary Disease

## 2010-10-24 DIAGNOSIS — R0609 Other forms of dyspnea: Secondary | ICD-10-CM

## 2010-10-24 DIAGNOSIS — R0989 Other specified symptoms and signs involving the circulatory and respiratory systems: Secondary | ICD-10-CM

## 2010-10-24 DIAGNOSIS — R0600 Dyspnea, unspecified: Secondary | ICD-10-CM

## 2010-10-24 LAB — PULMONARY FUNCTION TEST

## 2010-10-24 NOTE — Progress Notes (Signed)
PFT done today. 

## 2010-10-30 ENCOUNTER — Telehealth: Payer: Self-pay | Admitting: Pulmonary Disease

## 2010-10-30 NOTE — Telephone Encounter (Signed)
Please let pt know that his breathing studies are normal.  Nothing to explain his sob from pulmonary standpoint. Would recommend that he contact his primary md to consider a cardiac evaluation Please call Dr. Kerri Perches nurse and let her know pt will be calling, and that he has normal pfts.  Thanks.

## 2010-10-30 NOTE — Telephone Encounter (Signed)
ATC pt at both numbers in chart and hime number not accepting calls, and pt not at work, so I called emergency contact, his wife, and provided results. Also called Dr. Collins Scotland nurse and advised of recs as well. Carron Curie, CMA

## 2010-11-08 ENCOUNTER — Encounter: Payer: Self-pay | Admitting: Pulmonary Disease

## 2010-11-14 HISTORY — PX: NM MYOCAR PERF WALL MOTION: HXRAD629

## 2010-11-29 LAB — CBC
HCT: 46.6 % (ref 39.0–52.0)
MCHC: 33.8 g/dL (ref 30.0–36.0)
MCV: 96.2 fL (ref 78.0–100.0)
RBC: 4.84 MIL/uL (ref 4.22–5.81)
RDW: 13.3 % (ref 11.5–15.5)
WBC: 8.7 10*3/uL (ref 4.0–10.5)

## 2011-08-19 ENCOUNTER — Other Ambulatory Visit: Payer: Self-pay | Admitting: Neurosurgery

## 2011-08-19 DIAGNOSIS — M542 Cervicalgia: Secondary | ICD-10-CM

## 2011-09-04 ENCOUNTER — Ambulatory Visit
Admission: RE | Admit: 2011-09-04 | Discharge: 2011-09-04 | Disposition: A | Discharge status: Home / Self Care | Payer: BC Managed Care – PPO | Source: Ambulatory Visit | Attending: Neurosurgery | Admitting: Neurosurgery

## 2011-09-04 DIAGNOSIS — M542 Cervicalgia: Secondary | ICD-10-CM

## 2012-03-03 ENCOUNTER — Telehealth: Payer: Self-pay | Admitting: Pulmonary Disease

## 2012-03-03 MED ORDER — BUDESONIDE-FORMOTEROL FUMARATE 160-4.5 MCG/ACT IN AERO: 2.0000 | INHALATION_SPRAY | Freq: Two times a day (BID) | RESPIRATORY_TRACT | Status: DC

## 2012-03-03 NOTE — Telephone Encounter (Signed)
Pt aware that we have refilled his prescription but he will need a follow up appointment. He has been scheduled for 03/17/11 @ 1:45pm.

## 2012-03-16 ENCOUNTER — Ambulatory Visit: Payer: BC Managed Care – PPO | Admitting: Pulmonary Disease

## 2012-03-23 ENCOUNTER — Ambulatory Visit: Payer: BC Managed Care – PPO | Admitting: Pulmonary Disease

## 2012-04-22 ENCOUNTER — Ambulatory Visit: Payer: BC Managed Care – PPO | Admitting: Pulmonary Disease

## 2012-05-10 ENCOUNTER — Ambulatory Visit: Payer: BC Managed Care – PPO | Admitting: Pulmonary Disease

## 2012-05-27 ENCOUNTER — Encounter: Payer: Self-pay | Admitting: Pulmonary Disease

## 2012-05-27 ENCOUNTER — Ambulatory Visit (INDEPENDENT_AMBULATORY_CARE_PROVIDER_SITE_OTHER): Payer: BC Managed Care – PPO | Admitting: Pulmonary Disease

## 2012-05-27 VITALS — BP 132/82 | HR 80 | Temp 98.1°F | Ht 69.0 in | Wt 220.0 lb

## 2012-05-27 DIAGNOSIS — R0609 Other forms of dyspnea: Secondary | ICD-10-CM

## 2012-05-27 DIAGNOSIS — R0600 Dyspnea, unspecified: Secondary | ICD-10-CM

## 2012-05-27 DIAGNOSIS — R0989 Other specified symptoms and signs involving the circulatory and respiratory systems: Secondary | ICD-10-CM

## 2012-05-27 NOTE — Patient Instructions (Addendum)
Would try and come off your symbicort for 4 weeks now that you have been off cigarettes for a period of time. If you do well, would stay off medication and no followup is needed.  If you notice worsening breathing symptoms, then get back on symbicort and let us know so that we can phone in prescription.  Would also need to see you yearly.

## 2012-05-27 NOTE — Progress Notes (Signed)
  Subjective:    Patient ID: Scott Mccarty, male    DOB: 18-Nov-1960, 52 y.o.   MRN: [id number]  HPI Patient comes in today for followup of his recurrent asthmatic bronchitis, related to airway inflammation from ongoing smoking.  I last saw him in 2012, and he was being maintained on symbicort.  He has not been seen since, but the good news is that he has stopped smoking.  He feels that his breathing is much improved, and rarely has a cough or mucus.   Review of Systems  Constitutional: Negative for fever and unexpected weight change.  HENT: Negative for ear pain, nosebleeds, congestion, sore throat, rhinorrhea, sneezing, trouble swallowing, dental problem, postnasal drip and sinus pressure.   Eyes: Negative for redness and itching.  Respiratory: Positive for cough ( in the mornings). Negative for chest tightness, shortness of breath and wheezing.   Cardiovascular: Negative for palpitations and leg swelling.  Gastrointestinal: Negative for nausea and vomiting.       Acid reflux  Genitourinary: Negative for dysuria.  Musculoskeletal: Negative for joint swelling.  Skin: Negative for rash.  Neurological: Negative for headaches.  Hematological: Does not bruise/bleed easily.  Psychiatric/Behavioral: Negative for dysphoric mood. The patient is not nervous/anxious.        Objective:   Physical Exam Overweight male in no acute distress Nose without purulence or discharge noted Neck without lymphadenopathy or thyromegaly Chest totally clear to auscultation, no wheezing Cardiac exam with regular rate and rhythm Lower extremities without edema, cyanosis Alert and oriented, moves all 4 extremities.       Assessment & Plan:

## 2012-05-27 NOTE — Assessment & Plan Note (Signed)
The patient has a history of chronic asthmatic bronchitis related to his ongoing smoking.  He has done well with symbicort in the past, but now he has stopped smoking and is much improved.  I would like to give him a trial off the medication to see how well he does.  We have never shown definite airflow obstruction on pulmonary function studies in the past.  If he has worsening of his symptoms, he is to get back on the medication and let us know.

## 2012-08-16 ENCOUNTER — Telehealth: Payer: Self-pay | Admitting: Pulmonary Disease

## 2012-08-16 MED ORDER — BUDESONIDE-FORMOTEROL FUMARATE 160-4.5 MCG/ACT IN AERO: 2.0000 | INHALATION_SPRAY | Freq: Two times a day (BID) | RESPIRATORY_TRACT | Status: DC

## 2012-08-16 MED ORDER — ALBUTEROL SULFATE HFA 108 (90 BASE) MCG/ACT IN AERS: 2.0000 | INHALATION_SPRAY | Freq: Four times a day (QID) | RESPIRATORY_TRACT | Status: DC | PRN

## 2012-08-16 NOTE — Telephone Encounter (Signed)
Rx's have been sent in. Pt is aware. ROV has been scheduled for 10/21/2012 at 9am.

## 2012-10-21 ENCOUNTER — Ambulatory Visit (INDEPENDENT_AMBULATORY_CARE_PROVIDER_SITE_OTHER): Payer: BC Managed Care – PPO | Admitting: Pulmonary Disease

## 2012-10-21 ENCOUNTER — Encounter: Payer: Self-pay | Admitting: Pulmonary Disease

## 2012-10-21 VITALS — BP 128/78 | HR 75 | Temp 98.2°F | Ht 69.0 in | Wt 219.8 lb

## 2012-10-21 DIAGNOSIS — J449 Chronic obstructive pulmonary disease, unspecified: Secondary | ICD-10-CM

## 2012-10-21 NOTE — Patient Instructions (Addendum)
Stay on symbicort at night, but try to come off in a few months and see how you do. Work on weight loss and conditioning. followup with me in one year and as needed.

## 2012-10-21 NOTE — Assessment & Plan Note (Signed)
The pt has stopped smoking, and feels his breathing is much improved.  When he tried to stop using symbicort however, he noticed increased sob at night while trying to sleep.  His pfts in the past have shown no airflow obstruction.  It is unclear whether he may have some persistent airway inflammation despite smoking cessation, or whether he may have an asthmatic component to explain his symptoms.  I have asked him to continue with symbicort at [facility] for awhile, then try to re-challenge himself again.

## 2012-10-21 NOTE — Progress Notes (Signed)
  Subjective:    Patient ID: Scott Mccarty, male    DOB: [birth year], 52 y.o.   MRN: [id number]  HPI The patient comes in today for followup of his history of asthmatic bronchitis, felt to be secondary to airway inflammation from his ongoing smoking.  The patient has stopped smoking, and has seen considerable improvement in his breathing.  However, when he tried to stop the symbicort, he started having symptoms of increased shortness of breath at night while trying to sleep.  He restarted symbicort just at h.s., and has done during well since that time.   Review of Systems  Constitutional: Negative for fever and unexpected weight change.  HENT: Negative for ear pain, nosebleeds, congestion, sore throat, rhinorrhea, sneezing, trouble swallowing, dental problem, postnasal drip and sinus pressure.   Eyes: Negative for redness and itching.  Respiratory: Positive for cough and shortness of breath. Negative for chest tightness and wheezing.   Cardiovascular: Negative for palpitations and leg swelling.  Gastrointestinal: Negative for nausea and vomiting.  Genitourinary: Negative for dysuria.  Musculoskeletal: Negative for joint swelling.  Skin: Negative for rash.  Neurological: Negative for headaches.  Hematological: Does not bruise/bleed easily.  Psychiatric/Behavioral: Negative for dysphoric mood. The patient is not nervous/anxious.        Objective:   Physical Exam Overweight male in no acute Nose without purulent discharge noted Neck without lymphadenopathy or thyromegaly Chest with clear breath sounds, no wheezing Cardiac exam with regular rate and rhythm Lower extremities without edema, no cyanosis Alert and oriented, moves all 4 extremities.       Assessment & Plan:

## 2013-03-26 ENCOUNTER — Other Ambulatory Visit: Payer: Self-pay | Admitting: Pulmonary Disease

## 2013-08-03 ENCOUNTER — Encounter: Payer: Self-pay | Admitting: *Deleted

## 2013-08-04 ENCOUNTER — Ambulatory Visit (INDEPENDENT_AMBULATORY_CARE_PROVIDER_SITE_OTHER): Payer: BC Managed Care – PPO | Admitting: Cardiovascular Disease

## 2013-08-04 ENCOUNTER — Encounter: Payer: Self-pay | Admitting: Cardiovascular Disease

## 2013-08-04 VITALS — BP 132/84 | HR 80 | Ht 69.0 in | Wt 220.0 lb

## 2013-08-04 DIAGNOSIS — R0602 Shortness of breath: Secondary | ICD-10-CM

## 2013-08-04 DIAGNOSIS — R079 Chest pain, unspecified: Secondary | ICD-10-CM

## 2013-08-04 DIAGNOSIS — E785 Hyperlipidemia, unspecified: Secondary | ICD-10-CM

## 2013-08-04 NOTE — Patient Instructions (Signed)
  We will see you back in follow up after the tests.   Dr Berry has ordered: 1.  Echocardiogram. Echocardiography is a painless test that uses sound waves to create images of your heart. It provides your doctor with information about the size and shape of your heart and how well your heart's chambers and valves are working. This procedure takes approximately one hour. There are no restrictions for this procedure.   2. Exercise Myoview- this is a test that looks at the blood flow to your heart muscle.  It takes approximately 2 1/2 hours. Please follow instruction sheet, as given.      

## 2013-08-04 NOTE — Assessment & Plan Note (Signed)
On statin therapy. Recent lipid profile performed 07/25/13 revealed a total cholesterol 183, LDL of 128, HDL of 37

## 2013-08-04 NOTE — Progress Notes (Signed)
08/04/2013 Scott Mccarty   April 11, 1960  [id number]  Primary Physician Herb Grays, MD Primary Cardiologist: Runell Gess MD Roseanne Reno   HPI:  [name] is a 53 year old moderately overweight married Caucasian male father of 2 children, grandfather and one grandchild Works as a Production designer, theatre/television/film of autism. He was referred by Dr. Herb Grays for cardiovascular evaluation because of new-onset chest pain and progressive shortness of breath. He saw Dr. Alanda Amass in the past. She's had a normal echo and Myoview stress test back in 2012. His risk factors include treated hyperlipidemia as well as family history. Father had a stent in his 14s. He has never had a heart attack or stroke. He does have obstructive sleep apnea but does not wear CPAP. He does have 30-pack-year history of tobacco abuse quit 5 years ago. Over the last one to 2 months she's noticed chest pain that is now daily lasting minutes at a time with associated increasing shortness of breath.   Current Outpatient Prescriptions  Medication Sig Dispense Refill  . albuterol (PROVENTIL HFA) 108 (90 BASE) MCG/ACT inhaler Inhale 2 puffs into the lungs every 6 (six) hours as needed.  1 Inhaler  2  . atorvastatin (LIPITOR) 10 MG tablet Take 10 mg by mouth daily at 6 PM.       . gabapentin (NEURONTIN) 300 MG capsule 1 capsule at bedtime.      . SYMBICORT 160-4.5 MCG/ACT inhaler INHALE 2 PUFFS INTO THE LUNGS 2 (TWO) TIMES DAILY.  10.2 g  2  . Vitamin D, Ergocalciferol, (DRISDOL) 50000 UNITS CAPS capsule Take 50,000 Units by mouth every 7 (seven) days.        No current facility-administered medications for this visit.    Allergies  Allergen Reactions  . Celexa [Citalopram]   . Hydrocodone Itching    Liquid form.      History   Social History  . Marital Status: Married    Spouse Name: N/A    Number of Children: N/A  . Years of Education: N/A   Occupational History  . Occupational hygienist    Social History Main Topics  .  Smoking status: Former Smoker -- 1.00 packs/day for 22 years    Types: Cigarettes    Quit date: 04/25/2010  . Smokeless tobacco: Not on file  . Alcohol Use: Yes     Comment: 3-4 drinks per week  . Drug Use: No  . Sexual Activity: Not on file   Other Topics Concern  . Not on file   Social History Narrative  . No narrative on file     Review of Systems: General: negative for chills, fever, night sweats or weight changes.  Cardiovascular: negative for chest pain, dyspnea on exertion, edema, orthopnea, palpitations, paroxysmal nocturnal dyspnea or shortness of breath Dermatological: negative for rash Respiratory: negative for cough or wheezing Urologic: negative for hematuria Abdominal: negative for nausea, vomiting, diarrhea, bright red blood per rectum, melena, or hematemesis Neurologic: negative for visual changes, syncope, or dizziness All other systems reviewed and are otherwise negative except as noted above.    Blood pressure 132/84, pulse 80, height 5\' 9"  (1.753 m), weight 220 lb (99.791 kg).  General appearance: alert and no distress Neck: no adenopathy, no carotid bruit, no JVD, supple, symmetrical, trachea midline and thyroid not enlarged, symmetric, no tenderness/mass/nodules Lungs: clear to auscultation bilaterally Heart: regular rate and rhythm, S1, S2 normal, no murmur, click, rub or gallop Extremities: extremities normal, atraumatic, no cyanosis or edema and 2+  pedal pulses bilaterally  EKG normal sinus rhythm at 80 with nonspecific ST and T wave changes  ASSESSMENT AND PLAN:   Hyperlipidemia On statin therapy. Recent lipid profile performed 07/25/13 revealed a total cholesterol 183, LDL of 128, HDL of 37  Chest pain Patient relates one to 2 month history of substernal chest discomfort that occurs on a daily basis lasting minutes at a time. He also has noticed increasing shortness of breath at that time. I am going to get an exercise Myoview stress test as well as  a 2-D echocardiogram and we'll see him back after that for further evaluation      Runell GessJonathan J. Clide Remmers MD [facility] CenterFACP,FACC,FAHA, [facility] LLCFSCAI 08/04/2013 4:11 PM

## 2013-08-04 NOTE — Assessment & Plan Note (Signed)
Patient relates one to 2 month history of substernal chest discomfort that occurs on a daily basis lasting minutes at a time. He also has noticed increasing shortness of breath at that time. I am going to get an exercise Myoview stress test as well as a 2-D echocardiogram and we'll see him back after that for further evaluation

## 2013-08-05 ENCOUNTER — Telehealth (HOSPITAL_COMMUNITY): Payer: Self-pay

## 2013-08-09 ENCOUNTER — Ambulatory Visit (HOSPITAL_COMMUNITY)
Admission: RE | Admit: 2013-08-09 | Discharge: 2013-08-09 | Disposition: A | Discharge status: Home / Self Care | Payer: BC Managed Care – PPO | Source: Ambulatory Visit | Attending: Cardiology | Admitting: Cardiology

## 2013-08-09 DIAGNOSIS — Z8249 Family history of ischemic heart disease and other diseases of the circulatory system: Secondary | ICD-10-CM | POA: Insufficient documentation

## 2013-08-09 DIAGNOSIS — E663 Overweight: Secondary | ICD-10-CM | POA: Insufficient documentation

## 2013-08-09 DIAGNOSIS — J449 Chronic obstructive pulmonary disease, unspecified: Secondary | ICD-10-CM | POA: Insufficient documentation

## 2013-08-09 DIAGNOSIS — R0602 Shortness of breath: Secondary | ICD-10-CM

## 2013-08-09 DIAGNOSIS — Z87891 Personal history of nicotine dependence: Secondary | ICD-10-CM | POA: Insufficient documentation

## 2013-08-09 DIAGNOSIS — R0989 Other specified symptoms and signs involving the circulatory and respiratory systems: Secondary | ICD-10-CM | POA: Insufficient documentation

## 2013-08-09 DIAGNOSIS — R42 Dizziness and giddiness: Secondary | ICD-10-CM | POA: Insufficient documentation

## 2013-08-09 DIAGNOSIS — R0609 Other forms of dyspnea: Secondary | ICD-10-CM | POA: Insufficient documentation

## 2013-08-09 DIAGNOSIS — R9431 Abnormal electrocardiogram [ECG] [EKG]: Secondary | ICD-10-CM | POA: Insufficient documentation

## 2013-08-09 DIAGNOSIS — J4489 Other specified chronic obstructive pulmonary disease: Secondary | ICD-10-CM | POA: Insufficient documentation

## 2013-08-09 DIAGNOSIS — R079 Chest pain, unspecified: Secondary | ICD-10-CM | POA: Insufficient documentation

## 2013-08-09 MED ORDER — TECHNETIUM TC 99M SESTAMIBI GENERIC - CARDIOLITE
9.9000 | Freq: Once | INTRAVENOUS | Status: AC | PRN
  Administered 2013-08-09: 10 via INTRAVENOUS

## 2013-08-09 MED ORDER — TECHNETIUM TC 99M SESTAMIBI GENERIC - CARDIOLITE
31.8000 | Freq: Once | INTRAVENOUS | Status: AC | PRN
  Administered 2013-08-09: 31.8 via INTRAVENOUS

## 2013-08-09 NOTE — Procedures (Addendum)
[facility] [address] [place] [id number] [phone]  Cardiology Nuclear Med Study  Scott GerlachDavid Mccarty is a 53 y.o. male     MRN : [id number]     DOB: 12/09/1960  Procedure Date: 08/09/2013  Nuclear Med Background Indication for Stress Test:  Evaluation for Ischemia and Abnormal EKG History:  COPD and No prior cardiac history reported;Last NUC MPI in 2012;EF=68% Cardiac Risk Factors: Family History - CAD, History of Smoking, Lipids and Overweight  Symptoms:  Chest Pain, DOE, Fatigue, Light-Headedness and SOB   Nuclear Pre-Procedure Caffeine/Decaff Intake:  9:00pm NPO After: 7:00am   IV Site: R Forearm  IV 0.9% NS with Angio Cath:  20g  Chest Size (in):  52"  IV Started by: Scott OgrenAmanda Wease, RN  Height: 5\' 9"  (1.753 m)  Cup Size: n/a  BMI:  Body mass index is 32.47 kg/(m^2). Weight:  220 lb (99.791 kg)   Tech Comments:  n/a    Nuclear Med Study 1 or 2 day study: 1 day  Stress Test Type:  Stress  Order Authorizing Provider:  Nanetta BattyJonathan Bostyn Mccarty, Scott Mccarty   Resting Radionuclide: Technetium 5977m Sestamibi  Resting Radionuclide Dose: 9.9 mCi   Stress Radionuclide:  Technetium 6377m Sestamibi  Stress Radionuclide Dose: 31.8 mCi      Stress Protocol Rest HR: 86 Stress HR: 153  Rest BP: 116/86 Stress BP: 168/86  Exercise Time (min): 9. METS: 10.1   Predicted Max HR: 166 bpm % Max HR: 92.17 bpm Rate Pressure Product: [id number]  Dose of Adenosine (mg):  n/a Dose of Lexiscan: n/a mg  Dose of Atropine (mg): n/a Dose of Dobutamine: n/a mcg/kg/min (at max HR)  Stress Test Technologist: Scott Sheetserry-Marie Mccarty, CCT Nuclear Technologist: Scott LexPam Mccarty, CNMT   Rest Procedure:  Myocardial perfusion imaging was performed at rest 45 minutes following the intravenous administration of Technetium 5077m Sestamibi. Stress Procedure:  The patient performed treadmill exercise using a Bruce  Protocol for 9 minutes. The patient stopped due to Marked SOB and denied any  chest pain.  There were no significant ST-T wave changes.  Technetium 677m Sestamibi was injected IV at peak exercise and myocardial perfusion imaging was performed after a brief delay.  Transient Ischemic Dilatation (Normal <1.22):  1.23  QGS EDV:  77 ml QGS ESV:  29 ml LV Ejection Fraction: 62% .      Rest ECG: NSR - Normal EKG  Stress ECG: No significant ST segment change suggestive of ischemia.  QPS Raw Data Images:  Normal; no motion artifact; normal heart/lung ratio. Stress Images:  Normal homogeneous uptake in all areas of the myocardium. Rest Images:  Normal homogeneous uptake in all areas of the myocardium. Subtraction (SDS):  No evidence of ischemia.  Impression Exercise Capacity:  Good exercise capacity. BP Response:  Normal blood pressure response. Clinical Symptoms:  No significant symptoms noted. ECG Impression:  No significant ST segment change suggestive of ischemia. Comparison with Prior Nuclear Study: No significant change from previous study  Overall Impression:  Normal stress nuclear study.  LV Wall Motion:  NL LV Function; NL Wall Motion   Scott GessBERRY,Scott Mccarty, Scott Mccarty  08/09/2013 3:00 PM

## 2013-08-10 ENCOUNTER — Encounter: Payer: Self-pay | Admitting: *Deleted

## 2013-08-15 ENCOUNTER — Ambulatory Visit (HOSPITAL_COMMUNITY)
Admission: RE | Admit: 2013-08-15 | Discharge: 2013-08-15 | Disposition: A | Discharge status: Home / Self Care | Payer: BC Managed Care – PPO | Source: Ambulatory Visit | Attending: Cardiovascular Disease | Admitting: Cardiovascular Disease

## 2013-08-15 DIAGNOSIS — R079 Chest pain, unspecified: Secondary | ICD-10-CM

## 2013-08-15 DIAGNOSIS — R0602 Shortness of breath: Secondary | ICD-10-CM

## 2013-08-15 NOTE — Progress Notes (Signed)
2D Echocardiogram Complete.  08/15/2013   Bethany McMahill, RDCS

## 2013-08-18 ENCOUNTER — Encounter: Payer: Self-pay | Admitting: *Deleted

## 2013-08-24 NOTE — Telephone Encounter (Signed)
Encounter complete. 

## 2013-09-02 ENCOUNTER — Ambulatory Visit (HOSPITAL_BASED_OUTPATIENT_CLINIC_OR_DEPARTMENT_OTHER): Payer: BC Managed Care – PPO | Attending: Family Medicine

## 2013-09-02 VITALS — Ht 69.0 in | Wt 210.0 lb

## 2013-09-02 DIAGNOSIS — G4710 Hypersomnia, unspecified: Secondary | ICD-10-CM

## 2013-09-02 DIAGNOSIS — G4730 Sleep apnea, unspecified: Principal | ICD-10-CM

## 2013-09-03 NOTE — Sleep Study (Signed)
   NAME: Scherrie GerlachDavid Wickens DATE OF BIRTH:  Dec 27, 1960 MEDICAL RECORD NUMBER [id number]  LOCATION: [facility]  PHYSICIAN: Zayanna Pundt D  DATE OF STUDY: 09/02/2013  SLEEP STUDY TYPE: Nocturnal Polysomnogram               REFERRING PHYSICIAN: Herb GraysSpear, Tammy, MD  INDICATION FOR STUDY: Hypersomnia with sleep apnea-CPAP titration  EPWORTH SLEEPINESS SCORE:   7/24 HEIGHT: 5\' 9"  (175.3 cm)  WEIGHT: 210 lb (95.255 kg)    Body mass index is 31 kg/(m^2).  NECK SIZE: 17 in.  MEDICATIONS: Charted for review  SLEEP ARCHITECTURE: Total sleep time 324 minutes with sleep efficiency 88.8%. Stage I was 4.9%, stage II 81.5%, stage III absent, REM 13.6% of total sleep time. Sleep latency 5.5 minutes, REM latency 89 minutes, awake after sleep onset 35 minutes, arousal index 8.3, bedtime medication: Lipitor, Neurontin, Drisdol, naproxen  RESPIRATORY DATA: CPAP titration protocol. CPAP was titrated to 9 CWP, AHI 0.7 per hour. He wore a medium fullface mask with heated humidifier.  OXYGEN DATA: Snoring was prevented by CPAP and oxygen saturation mean was 94.4% on room air.  CARDIAC DATA: Sinus rhythm  MOVEMENT/PARASOMNIA: No significant movement disturbance, no bathroom trips  IMPRESSION/ RECOMMENDATION:   1) Successful CPAP titration to 9 CWP, AHI 0.7 per hour. He wore a medium [employer] Simplus fullface mask with heated humidifier. Snoring was prevented and mean oxygen saturation held at 94.4% on room air with CPAP. 2) baseline polysomnogram on 06/11/2006 had recorded AHI 24.2 per hour with body weight 221 pounds.   Signed Jetty Duhamellinton Monserrat Vidaurri M.D. Waymon BudgeYOUNG,Andrae Claunch D Diplomate, American Board of Sleep Medicine  ELECTRONICALLY SIGNED ON:  09/03/2013, 1:32 PM [facility] PH: [phone]   FX: [phone] ACCREDITED BY THE AMERICAN ACADEMY OF SLEEP MEDICINE

## 2013-09-05 ENCOUNTER — Other Ambulatory Visit: Payer: Self-pay | Admitting: Pulmonary Disease

## 2014-01-11 ENCOUNTER — Encounter: Payer: Self-pay | Admitting: Pulmonary Disease

## 2014-01-11 ENCOUNTER — Ambulatory Visit (INDEPENDENT_AMBULATORY_CARE_PROVIDER_SITE_OTHER): Payer: BC Managed Care – PPO | Admitting: Pulmonary Disease

## 2014-01-11 VITALS — BP 124/78 | HR 88 | Temp 97.3°F | Ht 69.0 in | Wt 222.2 lb

## 2014-01-11 DIAGNOSIS — J449 Chronic obstructive pulmonary disease, unspecified: Secondary | ICD-10-CM

## 2014-01-11 MED ORDER — ALBUTEROL SULFATE 108 (90 BASE) MCG/ACT IN AEPB: 2.0000 | INHALATION_SPRAY | Freq: Four times a day (QID) | RESPIRATORY_TRACT | Status: AC | PRN

## 2014-01-11 NOTE — Patient Instructions (Signed)
Try to come completely off symbicort.  Since you have quit smoking, very unlikely you still need. Will give you prescription for a rescue inhaler, proair respiclick, and can use 2 inhalations as needed if issues develop No reason to followup with me at this point, but will be more than happy to see you again if problems develop.

## 2014-01-11 NOTE — Progress Notes (Signed)
   Subjective:    Patient ID: Scott Mccarty, male    DOB: Jan 20, 1961, 53 y.o.   MRN: [id number]  HPI The patient comes in today for follow-up of his chronic asthmatic bronchitis related to ongoing smoking with airway inflammation. He has quit smoking, and has seen significant improvement in his breathing. I have asked him to stop the Symbicort, but he has been scared of his symptoms returning, and currently only takes 1 puff at bedtime. He currently has no significant shortness of breath or chest congestion. He is developing a head cold with postnasal drip.   Review of Systems  Constitutional: Negative for fever and unexpected weight change.  HENT: Positive for congestion, postnasal drip and sore throat. Negative for dental problem, ear pain, nosebleeds, rhinorrhea, sinus pressure, sneezing and trouble swallowing.   Eyes: Negative for redness and itching.  Respiratory: Positive for cough. Negative for chest tightness, shortness of breath and wheezing.   Cardiovascular: Negative for palpitations and leg swelling.  Gastrointestinal: Negative for nausea and vomiting.  Genitourinary: Negative for dysuria.  Musculoskeletal: Negative for joint swelling.  Skin: Negative for rash.  Neurological: Negative for headaches.  Hematological: Does not bruise/bleed easily.  Psychiatric/Behavioral: Negative for dysphoric mood. The patient is not nervous/anxious.        Objective:   Physical Exam Overweight male in no acute distress Nose without purulence or discharge noted Neck without lymphadenopathy or thyromegaly Chest totally clear to auscultation, no wheezing Cardiac exam with regular rate and rhythm Lower extremities without edema, no cyanosis Alert and oriented, moves all 4 extremities.       Assessment & Plan:

## 2014-01-11 NOTE — Assessment & Plan Note (Signed)
The patient much improved since he has totally quit smoking, but is still using one puff of Symbicort at bedtime because of fear of his breathing issues returning. I have explained that his spirometry showed no significant airflow obstruction, and now that he is off cigarettes his airway inflammation should have resolved. I will give him a rescue inhaler to have available if needed, and perhaps this will help him get completely off Symbicort.

## 2017-10-01 ENCOUNTER — Ambulatory Visit
Admission: RE | Admit: 2017-10-01 | Discharge: 2017-10-01 | Disposition: A | Discharge status: Home / Self Care | Payer: BLUE CROSS/BLUE SHIELD | Source: Ambulatory Visit | Attending: Physician Assistant | Admitting: Physician Assistant

## 2017-10-01 ENCOUNTER — Other Ambulatory Visit: Payer: Self-pay | Admitting: Physician Assistant

## 2017-10-01 DIAGNOSIS — R0602 Shortness of breath: Secondary | ICD-10-CM

## 2017-10-02 ENCOUNTER — Telehealth: Payer: Self-pay | Admitting: *Deleted

## 2017-10-02 NOTE — Telephone Encounter (Signed)
[facility]  [place], [place], [phone]

## 2017-11-04 ENCOUNTER — Encounter: Payer: Self-pay | Admitting: Cardiovascular Disease

## 2017-11-04 ENCOUNTER — Ambulatory Visit: Payer: BLUE CROSS/BLUE SHIELD | Admitting: Cardiovascular Disease

## 2017-11-04 VITALS — BP 128/90 | HR 83 | Ht 69.0 in | Wt 213.2 lb

## 2017-11-04 DIAGNOSIS — R0609 Other forms of dyspnea: Secondary | ICD-10-CM

## 2017-11-04 DIAGNOSIS — G4733 Obstructive sleep apnea (adult) (pediatric): Secondary | ICD-10-CM | POA: Diagnosis not present

## 2017-11-04 DIAGNOSIS — R0600 Dyspnea, unspecified: Secondary | ICD-10-CM

## 2017-11-04 DIAGNOSIS — I208 Other forms of angina pectoris: Secondary | ICD-10-CM

## 2017-11-04 DIAGNOSIS — E7800 Pure hypercholesterolemia, unspecified: Secondary | ICD-10-CM | POA: Diagnosis not present

## 2017-11-04 DIAGNOSIS — E119 Type 2 diabetes mellitus without complications: Secondary | ICD-10-CM | POA: Insufficient documentation

## 2017-11-04 NOTE — Assessment & Plan Note (Signed)
History of hyperlipidemia intolerant to statin therapy 

## 2017-11-04 NOTE — Assessment & Plan Note (Signed)
History of chest pain in the past when I saw him back in 2015 with a normal 2D echo and a negative Myoview stress test.  Recently he is noticed increased fatigue, some dyspnea.  I am going to repeat a 2D echo and a Myoview stress test to further evaluate.  He has recently been diagnosed with diabetes as well.  His EKG shows new Q waves in the inferior leads.

## 2017-11-04 NOTE — Assessment & Plan Note (Signed)
History of obstructive sleep apnea on CPAP which she wears intermittently. 

## 2017-11-04 NOTE — Progress Notes (Signed)
11/04/2017 Scott Mccarty   [birth year]  [id number]  Primary Physician Scott Kill, PA-C Primary Cardiologist: Scott Gess MD Scott Mccarty, [place]  HPI:  Scott Mccarty is a 57 y.o.  moderately overweight married Caucasian male father of 2 children, grandfather and one grandchild Works as a Production designer, theatre/television/film at [employer].  He was referred by Dr. Herb Mccarty for cardiovascular evaluation because of new-onset chest pain and progressive shortness of breath.  His current primary care provider is Scott Mccarty, [place] he saw Dr. Alanda Mccarty in the past. She's had a normal echo and Myoview stress test back in 2012. His risk factors include treated hyperlipidemia as well as family history. Father had a stent in his 44s. He has never had a heart attack or stroke. He does have obstructive sleep apnea but wears CPAP intermittently. He does have 30-pack-year history of tobacco abuse quit 10 years ago.  When I saw him 4 years ago he was complaining of chest pain and shortness of breath.  A 2D echo and a Myoview stress test at that time were essentially normal.  His symptoms resolved at that time.  Since I saw him 4 years ago he has developed diabetes.  He has less energy, more fatigue and dyspnea over the last several months.   Current Meds  Medication Sig  . Albuterol Sulfate (PROAIR RESPICLICK) 108 (90 BASE) MCG/ACT AEPB Inhale 2 puffs into the lungs every 6 (six) hours as needed.  . meloxicam (MOBIC) 15 MG tablet Take by mouth.  . metFORMIN (GLUCOPHAGE-XR) 500 MG 24 hr tablet TAKE TWO TABLETS BY MOUTH TWICE DAILY  . Pseudoeph-Doxylamine-DM-APAP (NYQUIL PO) Take by mouth at bedtime.  . pseudoephedrine (SUDAFED) 30 MG tablet Take 30 mg by mouth at bedtime as needed for congestion.  . SYMBICORT 160-4.5 MCG/ACT inhaler INHALE 2 PUFFS INTO THE LUNGS 2 (TWO) TIMES DAILY. (Patient taking differently: 1 puff at bedtime)     Allergies  Allergen Reactions  . Celexa [Citalopram]   .  Hydrocodone Itching    Liquid form.      Social History   Socioeconomic History  . Marital status: Married    Spouse name: Not on file  . Number of children: Not on file  . Years of education: Not on file  . Highest education level: Not on file  Occupational History  . Occupation: Occupational hygienist  Social Needs  . Financial resource strain: Not on file  . Food insecurity:    Worry: Not on file    Inability: Not on file  . Transportation needs:    Medical: Not on file    Non-medical: Not on file  Tobacco Use  . Smoking status: Former Smoker    Packs/day: 1.00    Years: 22.00    Pack years: 22.00    Types: Cigarettes    Last attempt to quit: 04/25/2010    Years since quitting: 7.5  . Smokeless tobacco: Never Used  Substance and Sexual Activity  . Alcohol use: Yes    Comment: 3-4 drinks per week  . Drug use: No  . Sexual activity: Not on file  Lifestyle  . Physical activity:    Days per week: Not on file    Minutes per session: Not on file  . Stress: Not on file  Relationships  . Social connections:    Talks on phone: Not on file    Gets together: Not on file    Attends religious service: Not on  file    Active member of club or organization: Not on file    Attends meetings of clubs or organizations: Not on file    Relationship status: Not on file  . Intimate partner violence:    Fear of current or ex partner: Not on file    Emotionally abused: Not on file    Physically abused: Not on file    Forced sexual activity: Not on file  Other Topics Concern  . Not on file  Social History Narrative  . Not on file     Review of Systems: General: negative for chills, fever, night sweats or weight changes.  Cardiovascular: negative for chest pain, dyspnea on exertion, edema, orthopnea, palpitations, paroxysmal nocturnal dyspnea or shortness of breath Dermatological: negative for rash Respiratory: negative for cough or wheezing Urologic: negative for hematuria Abdominal:  negative for nausea, vomiting, diarrhea, bright red blood per rectum, melena, or hematemesis Neurologic: negative for visual changes, syncope, or dizziness All other systems reviewed and are otherwise negative except as noted above.    Blood pressure 128/90, pulse 83, height 5\' 9"  (1.753 m), weight 213 lb 3.2 oz (96.7 kg).  General appearance: alert and no distress Neck: no adenopathy, no carotid bruit, no JVD, supple, symmetrical, trachea midline and thyroid not enlarged, symmetric, no tenderness/mass/nodules Lungs: clear to auscultation bilaterally Heart: regular rate and rhythm, S1, S2 normal, no murmur, click, rub or gallop Extremities: extremities normal, atraumatic, no cyanosis or edema Pulses: 2+ and symmetric Skin: Skin color, texture, turgor normal. No rashes or lesions Neurologic: Alert and oriented X 3, normal strength and tone. Normal symmetric reflexes. Normal coordination and gait  EKG sinus rhythm 83 with inferior Q waves unchanged from prior EKGs.  I personally reviewed this EKG.  ASSESSMENT AND PLAN:   OBSTRUCTIVE SLEEP APNEA History of obstructive sleep apnea on CPAP which she wears intermittently.  Hyperlipidemia History of hyperlipidemia intolerant to statin therapy.  Chest pain History of chest pain in the past when I saw him back in 2015 with a normal 2D echo and a negative Myoview stress test.  Recently he is noticed increased fatigue, some dyspnea.  I am going to repeat a 2D echo and a Myoview stress test to further evaluate.  He has recently been diagnosed with diabetes as well.  His EKG shows new Q waves in the inferior leads.      Scott Gess MD FACP,FACC,FAHA, [facility] 11/04/2017 2:31 PM

## 2017-11-04 NOTE — Patient Instructions (Signed)
Medication Instructions:  Your physician recommends that you continue on your current medications as directed. Please refer to the Current Medication list given to you today.   Labwork: none  Testing/Procedures: Your physician has requested that you have an echocardiogram. Echocardiography is a painless test that uses sound waves to create images of your heart. It provides your doctor with information about the size and shape of your heart and how well your heart's chambers and valves are working. This procedure takes approximately one hour. There are no restrictions for this procedure.  Your physician has requested that you have en exercise stress myoview. For further information please visit www.[email or web address]. Please follow instruction sheet, as given.    Follow-Up: Your physician recommends that you schedule a follow-up appointment in: 4-6 weeks with Dr. Berry.   Any Other Special Instructions Will Be Listed Below (If Applicable).     If you need a refill on your cardiac medications before your next appointment, please call your pharmacy.   

## 2017-11-05 ENCOUNTER — Telehealth (HOSPITAL_COMMUNITY): Payer: Self-pay

## 2017-11-05 NOTE — Telephone Encounter (Signed)
Encounter complete. 

## 2017-11-10 ENCOUNTER — Ambulatory Visit (HOSPITAL_COMMUNITY)
Admission: RE | Admit: 2017-11-10 | Discharge: 2017-11-10 | Disposition: A | Discharge status: Home / Self Care | Payer: BLUE CROSS/BLUE SHIELD | Source: Ambulatory Visit | Attending: Cardiology | Admitting: Cardiology

## 2017-11-10 DIAGNOSIS — I208 Other forms of angina pectoris: Secondary | ICD-10-CM

## 2017-11-10 LAB — MYOCARDIAL PERFUSION IMAGING
CHL CUP MPHR: 162 {beats}/min
CHL CUP RESTING HR STRESS: 70 {beats}/min
CSEPED: 9 min
CSEPEDS: 1 s
CSEPHR: 90 %
Estimated workload: 10.7 METS
LV dias vol: 92 mL (ref 62–150)
LV sys vol: 45 mL
NUC STRESS TID: 1.17
Peak HR: 146 {beats}/min
RPE: 19
SDS: 2
SRS: 0
SSS: 2

## 2017-11-10 MED ORDER — TECHNETIUM TC 99M TETROFOSMIN IV KIT
27.0000 | PACK | Freq: Once | INTRAVENOUS | Status: AC | PRN
  Administered 2017-11-10: 27 via INTRAVENOUS
  Filled 2017-11-10: qty 27

## 2017-11-10 MED ORDER — TECHNETIUM TC 99M TETROFOSMIN IV KIT
8.2000 | PACK | Freq: Once | INTRAVENOUS | Status: AC | PRN
  Administered 2017-11-10: 8.2 via INTRAVENOUS
  Filled 2017-11-10: qty 9

## 2017-11-11 ENCOUNTER — Other Ambulatory Visit: Payer: Self-pay

## 2017-11-11 ENCOUNTER — Ambulatory Visit (HOSPITAL_COMMUNITY): Payer: BLUE CROSS/BLUE SHIELD | Attending: Internal Medicine

## 2017-11-11 DIAGNOSIS — I208 Other forms of angina pectoris: Secondary | ICD-10-CM

## 2017-11-11 DIAGNOSIS — R0600 Dyspnea, unspecified: Secondary | ICD-10-CM | POA: Diagnosis present

## 2017-11-11 DIAGNOSIS — I517 Cardiomegaly: Secondary | ICD-10-CM | POA: Diagnosis not present

## 2017-11-11 DIAGNOSIS — I358 Other nonrheumatic aortic valve disorders: Secondary | ICD-10-CM | POA: Insufficient documentation

## 2017-12-22 ENCOUNTER — Ambulatory Visit: Payer: BLUE CROSS/BLUE SHIELD | Admitting: Cardiovascular Disease

## 2018-02-18 ENCOUNTER — Ambulatory Visit: Payer: BLUE CROSS/BLUE SHIELD | Admitting: Cardiovascular Disease

## 2018-02-18 ENCOUNTER — Encounter: Payer: Self-pay | Admitting: Cardiovascular Disease

## 2018-02-18 DIAGNOSIS — E7800 Pure hypercholesterolemia, unspecified: Secondary | ICD-10-CM

## 2018-02-18 DIAGNOSIS — G4733 Obstructive sleep apnea (adult) (pediatric): Secondary | ICD-10-CM | POA: Diagnosis not present

## 2018-02-18 NOTE — Progress Notes (Signed)
02/18/2018 Scott Mccarty   21-Jul-1960  [id number]  Primary Physician Roger KillWilliams, Breejante J, PA-C Primary Cardiologist: Runell GessJonathan J Berry MD Nicholes CalamityFACP, FACC, FAHA, [place]  HPI:  Scott GerlachDavid Mccarty is a 57 y.o.  moderately overweight married Caucasian male father of 2 children, grandfather and one grandchild Works as a Production designer, theatre/television/filmmanager at [employer].  He was referred by Dr. Herb Graysammy Spear for cardiovascular evaluation because of new-onset chest pain and progressive shortness of breath.  His current primary care provider is Rueben BashBreejante  Williams, [place] he saw Dr. Alanda AmassWeintraub in the past.  I last saw him in the office 11/04/2017.  He has had a normal echo and Myoview stress test back in 2012. His risk factors include treated hyperlipidemia as well as family history. Father had a stent in his 1350s. He has never had a heart attack or stroke. He does have obstructive sleep apnea but wears CPAP intermittently. He does have 30-pack-year history of tobacco abuse quit 10 years ago.   Since I saw him 3 months ago he is remained stable.  He does get occasional dyspnea.  He had a 2D echocardiogram performed 11/11/2017 which showed an EF of 50 to 55% with mild LVH and a Myoview stress test performed 11/10/2017 which was essentially normal without ischemia.  His EKG last visit did show new inferior Q waves of unclear significance especially in the absence of a wall motion abnormality or perfusion deficit.    Current Meds  Medication Sig  . Albuterol Sulfate (PROAIR RESPICLICK) 108 (90 BASE) MCG/ACT AEPB Inhale 2 puffs into the lungs every 6 (six) hours as needed.  . meloxicam (MOBIC) 15 MG tablet Take by mouth.  . metFORMIN (GLUCOPHAGE-XR) 500 MG 24 hr tablet TAKE TWO TABLETS BY MOUTH TWICE DAILY  . Pseudoeph-Doxylamine-DM-APAP (NYQUIL PO) Take by mouth at bedtime.  . pseudoephedrine (SUDAFED) 30 MG tablet Take 30 mg by mouth at bedtime as needed for congestion.  . SYMBICORT 160-4.5 MCG/ACT inhaler INHALE 2 PUFFS INTO THE LUNGS 2  (TWO) TIMES DAILY. (Patient taking differently: 1 puff at bedtime)     Allergies  Allergen Reactions  . Celexa [Citalopram]   . Hydrocodone Itching    Liquid form.      Social History   Socioeconomic History  . Marital status: Married    Spouse name: Not on file  . Number of children: Not on file  . Years of education: Not on file  . Highest education level: Not on file  Occupational History  . Occupation: Occupational hygienistetail manager  Social Needs  . Financial resource strain: Not on file  . Food insecurity:    Worry: Not on file    Inability: Not on file  . Transportation needs:    Medical: Not on file    Non-medical: Not on file  Tobacco Use  . Smoking status: Former Smoker    Packs/day: 1.00    Years: 22.00    Pack years: 22.00    Types: Cigarettes    Last attempt to quit: 04/25/2010    Years since quitting: 7.8  . Smokeless tobacco: Never Used  Substance and Sexual Activity  . Alcohol use: Yes    Comment: 3-4 drinks per week  . Drug use: No  . Sexual activity: Not on file  Lifestyle  . Physical activity:    Days per week: Not on file    Minutes per session: Not on file  . Stress: Not on file  Relationships  . Social connections:  Talks on phone: Not on file    Gets together: Not on file    Attends religious service: Not on file    Active member of club or organization: Not on file    Attends meetings of clubs or organizations: Not on file    Relationship status: Not on file  . Intimate partner violence:    Fear of current or ex partner: Not on file    Emotionally abused: Not on file    Physically abused: Not on file    Forced sexual activity: Not on file  Other Topics Concern  . Not on file  Social History Narrative  . Not on file     Review of Systems: General: negative for chills, fever, night sweats or weight changes.  Cardiovascular: negative for chest pain, dyspnea on exertion, edema, orthopnea, palpitations, paroxysmal nocturnal dyspnea or shortness of  breath Dermatological: negative for rash Respiratory: negative for cough or wheezing Urologic: negative for hematuria Abdominal: negative for nausea, vomiting, diarrhea, bright red blood per rectum, melena, or hematemesis Neurologic: negative for visual changes, syncope, or dizziness All other systems reviewed and are otherwise negative except as noted above.    Blood pressure 124/76, pulse 80, height 5\' 9"  (1.753 m), weight 217 lb (98.4 kg).  General appearance: alert and no distress Neck: no adenopathy, no carotid bruit, no JVD, supple, symmetrical, trachea midline and thyroid not enlarged, symmetric, no tenderness/mass/nodules Lungs: clear to auscultation bilaterally Heart: regular rate and rhythm, S1, S2 normal, no murmur, click, rub or gallop Extremities: extremities normal, atraumatic, no cyanosis or edema Pulses: 2+ and symmetric Skin: Skin color, texture, turgor normal. No rashes or lesions Neurologic: Alert and oriented X 3, normal strength and tone. Normal symmetric reflexes. Normal coordination and gait  EKG not performed today  ASSESSMENT AND PLAN:   OBSTRUCTIVE SLEEP APNEA History of obstructive sleep apnea currently not wearing CPAP.  Hyperlipidemia History of hyperlipidemia not on statin therapy.  We will check a fasting lipid profile today.      Runell GessJonathan J. Berry MD FACP,FACC,FAHA, [facility] 02/18/2018 9:12 AM

## 2018-02-18 NOTE — Assessment & Plan Note (Signed)
History of obstructive sleep apnea currently not wearing CPAP. °

## 2018-02-18 NOTE — Assessment & Plan Note (Signed)
History of hyperlipidemia not on statin therapy.  We will check a fasting lipid profile today.

## 2018-02-18 NOTE — Patient Instructions (Signed)
Medication Instructions:  Your physician recommends that you continue on your current medications as directed. Please refer to the Current Medication list given to you today.  If you need a refill on your cardiac medications before your next appointment, please call your pharmacy.   Lab work: Your physician recommends that you HAVE for lab work  (LIPID/LIVER PANEL) DONE AT YOUR PRIMARY CARE PROVIDER'S OFFICE If you have labs (blood work) drawn today and your tests are completely normal, you will receive your results only by: Marland Kitchen. MyChart Message (if you have MyChart) OR . A paper copy in the mail If you have any lab test that is abnormal or we need to change your treatment, we will call you to review the results.  Testing/Procedures: NONE  Follow-Up: At [facility], you and your health needs are our priority.  As part of our continuing mission to provide you with exceptional heart care, we have created designated Provider Care Teams.  These Care Teams include your primary Cardiologist (physician) and Advanced Practice Providers (APPs -  Physician Assistants and Nurse Practitioners) who all work together to provide you with the care you need, when you need it. You will need a follow up appointment in 6 months with an APP and 12 months with Dr. Allyson SabalBerry.  Please call our office 2 months in advance to schedule this appointment. or one of the following Advanced Practice Providers on your designated Care Team:   Corine ShelterLuke Kilroy, PA-C StonegaKrista Kroeger, [place] . Marjie Skiffallie Goodrich, PA-C

## 2018-12-15 ENCOUNTER — Ambulatory Visit: Payer: BLUE CROSS/BLUE SHIELD | Admitting: Pulmonary Disease

## 2018-12-15 ENCOUNTER — Other Ambulatory Visit: Payer: Self-pay

## 2018-12-15 VITALS — BP 128/80 | HR 74 | Temp 98.5°F | Ht 69.0 in | Wt 206.8 lb

## 2018-12-15 DIAGNOSIS — K219 Gastro-esophageal reflux disease without esophagitis: Secondary | ICD-10-CM

## 2018-12-15 DIAGNOSIS — G4733 Obstructive sleep apnea (adult) (pediatric): Secondary | ICD-10-CM

## 2018-12-15 DIAGNOSIS — Z9989 Dependence on other enabling machines and devices: Secondary | ICD-10-CM

## 2018-12-15 DIAGNOSIS — R0602 Shortness of breath: Secondary | ICD-10-CM | POA: Diagnosis not present

## 2018-12-15 MED ORDER — SPIRIVA RESPIMAT 2.5 MCG/ACT IN AERS: 2.0000 | INHALATION_SPRAY | Freq: Every day | RESPIRATORY_TRACT | 0 refills | Status: DC

## 2018-12-15 MED ORDER — PANTOPRAZOLE SODIUM 40 MG PO TBEC: 40.0000 mg | DELAYED_RELEASE_TABLET | Freq: Every day | ORAL | 11 refills | Status: DC

## 2018-12-15 NOTE — Patient Instructions (Addendum)
Thank you for visiting Dr. Valeta Harms at [facility]. Today we recommend the following:  Orders Placed This Encounter  Procedures  . Pulmonary Function Test   Meds ordered this encounter  Medications  . pantoprazole (PROTONIX) 40 MG tablet    Sig: Take 1 tablet (40 mg total) by mouth daily before breakfast.    Dispense:  30 tablet    Refill:  11   Samples spiriva respimat   Please set patient up with Dr. Ander Slade or [facility] for next available sleep consultation.  Return in about 8 weeks (around 02/09/2019).    Please do your part to reduce the spread of COVID-19.

## 2018-12-15 NOTE — Progress Notes (Addendum)
Synopsis: Referred in Oct 2020 for SOB by Roger KillWilliams, Breejante J, *  Subjective:   PATIENT ID: Scott Mccarty GENDER: male DOB: [birth year], MRN: [id number]  Chief Complaint  Patient presents with  . Consult    Former Dr. Shelle Ironlance pt. Seen back in 2014 for Asthma. Quit smoking 2010. He reports increased SOB over last 6 months. Was evaluated by Cardiology. SOB with exertion and bending over that sometimes causes Hyperventilation. Using symbicort daily.     PMH of DMII, PVC. C/o SOB which has been worse over the past few months. Former smoker, quit 2014, smoked for >30 years, 1-1.5ppd. He has been having worsening symptoms over the past few months. He has been on symbicort for several years. No seasonal allergies. No birds or pets in the home. When he lived in [place] he had trouble with allergies. No one else smokes in the home. He had nocturnal symptoms, trouble breathing. He knows if he misses a dose of his inhaler. He never has asthma symptoms as a kid. Previous spirometry had normal fev1 and fvc ratio.  Patient also currently complaining of multiple sleep problems.  Gets up many times in the night.  He has nocturnal gastroesophageal reflux symptoms.  Chest burning.  Routine having to sleep on 2 pillows to keep himself up.  Also wakes up multiple times with trouble breathing.  He has OSA was placed on CPAP several years ago not compliant with CPAP because he feels like he is drowning when he wears it.  He does not have a sleep physician.  Has not followed up regarding any titration needs for this.  He also routinely falls asleep on the couch early in the evening.  He is even been told that he falls asleep when his wife is talking to him.    Past Medical History:  Diagnosis Date  . Carpal tunnel syndrome   . Chest pain   . Degeneration of cervical intervertebral disc   . Diabetes (HCC)   . Hyperlipidemia   . Obstructive sleep apnea (adult) (pediatric)   . PVC's (premature ventricular  contractions)   . Shortness of breath      Family History  Problem Relation Age of Onset  . Pancreatic cancer Mother   . Heart disease Father   . Breast cancer Sister   . Lung cancer Maternal Grandmother   . Heart disease Maternal Grandfather      Past Surgical History:  Procedure Laterality Date  . BACK SURGERY  10/2009  . HERNIA REPAIR  03/2006  . NECK SURGERY  11/2006  . NM MYOCAR PERF WALL MOTION  11/14/2010   normal    Social History   Socioeconomic History  . Marital status: Married    Spouse name: Not on file  . Number of children: Not on file  . Years of education: Not on file  . Highest education level: Not on file  Occupational History  . Occupation: Occupational hygienistetail manager  Social Needs  . Financial resource strain: Not on file  . Food insecurity    Worry: Not on file    Inability: Not on file  . Transportation needs    Medical: Not on file    Non-medical: Not on file  Tobacco Use  . Smoking status: Former Smoker    Packs/day: 1.00    Years: 22.00    Pack years: 22.00    Types: Cigarettes    Quit date: 04/25/2010    Years since quitting: 8.6  . Smokeless tobacco:  Never Used  Substance and Sexual Activity  . Alcohol use: Yes    Comment: 3-4 drinks per week  . Drug use: No  . Sexual activity: Not on file  Lifestyle  . Physical activity    Days per week: Not on file    Minutes per session: Not on file  . Stress: Not on file  Relationships  . Social Musician on phone: Not on file    Gets together: Not on file    Attends religious service: Not on file    Active member of club or organization: Not on file    Attends meetings of clubs or organizations: Not on file    Relationship status: Not on file  . Intimate partner violence    Fear of current or ex partner: Not on file    Emotionally abused: Not on file    Physically abused: Not on file    Forced sexual activity: Not on file  Other Topics Concern  . Not on file  Social History Narrative   . Not on file     Allergies  Allergen Reactions  . Celexa [Citalopram]   . Hydrocodone Itching    Liquid form.       Outpatient Medications Prior to Visit  Medication Sig Dispense Refill  . Albuterol Sulfate (PROAIR RESPICLICK) 108 (90 BASE) MCG/ACT AEPB Inhale 2 puffs into the lungs every 6 (six) hours as needed. 1 each 0  . atorvastatin (LIPITOR) 20 MG tablet Take by mouth.    . meloxicam (MOBIC) 15 MG tablet Take by mouth.    . metFORMIN (GLUCOPHAGE-XR) 500 MG 24 hr tablet TAKE TWO TABLETS BY MOUTH TWICE DAILY    . Pseudoeph-Doxylamine-DM-APAP (NYQUIL PO) Take by mouth at bedtime.    . pseudoephedrine (SUDAFED) 30 MG tablet Take 30 mg by mouth at bedtime as needed for congestion.    . SYMBICORT 160-4.5 MCG/ACT inhaler INHALE 2 PUFFS INTO THE LUNGS 2 (TWO) TIMES DAILY. (Patient taking differently: 1 puff at bedtime) 10.2 Mccarty 0   No facility-administered medications prior to visit.     Review of Systems  Constitutional: Positive for malaise/fatigue. Negative for chills, fever and weight loss.  HENT: Negative for hearing loss, sore throat and tinnitus.   Eyes: Negative for blurred vision and double vision.  Respiratory: Positive for shortness of breath. Negative for cough, hemoptysis, sputum production, wheezing and stridor.   Cardiovascular: Negative for chest pain, palpitations, orthopnea, leg swelling and PND.  Gastrointestinal: Positive for heartburn. Negative for abdominal pain, constipation, diarrhea, nausea and vomiting.  Genitourinary: Negative for dysuria, hematuria and urgency.  Musculoskeletal: Negative for joint pain and myalgias.  Skin: Negative for itching and rash.  Neurological: Negative for dizziness, tingling, weakness and headaches.  Endo/Heme/Allergies: Negative for environmental allergies. Does not bruise/bleed easily.  Psychiatric/Behavioral: Negative for depression. The patient is not nervous/anxious and does not have insomnia.   All other systems reviewed  and are negative.    Objective:  Physical Exam Vitals signs reviewed.  Constitutional:      General: He is not in acute distress.    Appearance: He is well-developed. He is obese.  HENT:     Head: Normocephalic and atraumatic.  Eyes:     General: No scleral icterus.    Conjunctiva/sclera: Conjunctivae normal.     Pupils: Pupils are equal, round, and reactive to light.  Neck:     Musculoskeletal: Neck supple.     Vascular: No JVD.  Trachea: No tracheal deviation.  Cardiovascular:     Rate and Rhythm: Normal rate and regular rhythm.     Heart sounds: Normal heart sounds. No murmur.  Pulmonary:     Effort: Pulmonary effort is normal. No tachypnea, accessory muscle usage or respiratory distress.     Breath sounds: Normal breath sounds. No stridor. No wheezing, rhonchi or rales.  Abdominal:     General: Bowel sounds are normal. There is no distension.     Palpations: Abdomen is soft.     Tenderness: There is no abdominal tenderness.     Comments: Obese   Musculoskeletal:        General: No tenderness.  Lymphadenopathy:     Cervical: No cervical adenopathy.  Skin:    General: Skin is warm and dry.     Capillary Refill: Capillary refill takes less than 2 seconds.  Neurological:     Mental Status: He is alert and oriented to person, place, and time.  Psychiatric:        Behavior: Behavior normal.      Vitals:   12/15/18 1505  BP: 128/80  Pulse: 74  Temp: 98.5 F (36.9 C)  TempSrc: Temporal  SpO2: 98%  Weight: 206 lb 12.8 oz (93.8 kg)  Height:  (1.753 m)   98% on RA BMI Readings from Last 3 Encounters:  12/15/18 30.54 kg/m  02/18/18 32.05 kg/m  11/10/17 31.45 kg/m   Wt Readings from Last 3 Encounters:  12/15/18 206 lb 12.8 oz (93.8 kg)  02/18/18 217 lb (98.4 kg)  11/10/17 213 lb (96.6 kg)     CBC    Component Value Date/Time   WBC 8.7 01/27/2008 1215   RBC 4.84 01/27/2008 1215   HGB 15.7 01/27/2008 1215   HCT 46.6 01/27/2008 1215   PLT  186 01/27/2008 1215   MCV 96.2 01/27/2008 1215   MCHC 33.8 01/27/2008 1215   RDW 13.3 01/27/2008 1215     Chest Imaging: 10/02/2017 chest x-ray: No active pulmonary disease. The patient's images have been independently reviewed by me.    Pulmonary Functions Testing Results: No flowsheet data found.  FeNO: None   Pathology: None   Echocardiogram: None   Heart Catheterization: None     Assessment & Plan:     ICD-10-CM   1. SOB (shortness of breath)  R06.02 Pulmonary Function Test    pantoprazole (PROTONIX) 40 MG tablet  2. Gastroesophageal reflux disease without esophagitis  K21.9 Pulmonary Function Test    pantoprazole (PROTONIX) 40 MG tablet  3. OSA on CPAP  G47.33    Z99.89     Discussion:  58 year old gentleman with complaints of shortness of breath.  Has a history of bronchial reactivity.  Was diagnosed with asthma potentially in the past.  He has been treated with Symbicort for several years.  He does well with this but he has noticed at nighttime his symptoms are now worse.  He has obstructive sleep apnea was placed on CPAP but does not wear this.  Has not had any recent follow-up with a sleep physician.  We will set him up with a new consult to see one of our sleep docs in clinic.  He also needs his gastroesophageal reflux treatment.  I will start him today on a PPI.  I think he should continue his current inhaler regimen.  He has not had pulmonary function test in greater than 8 years.  He does have a significant smoking history.  He smoked for 1  to 1.5 packs/day for greater than 30 years.  His previous set of pulmonary function tests were spirometry with no evidence of obstruction however his flow volume loop does show a slight scooping.  Plan: I think he should have a new set of pulmonary function tests Continue Symbicort Start Protonix Follow-up with new sleep physician to work with CPAP machine. Samples today of Spiriva Respimat  Due to patient's smoking  history we will need to address referral to lung cancer screening program.  I did not discuss that with him today in the office but we will discuss it at his next office visit.  Patient to return in 6 to 8 weeks after pulmonary function test have been completed.  Greater than 50% of this patient's 45-minute of visit was spent face-to-face discussing above recommendations and treatment plan.    Current Outpatient Medications:  .  Albuterol Sulfate (PROAIR RESPICLICK) 989 (90 BASE) MCG/ACT AEPB, Inhale 2 puffs into the lungs every 6 (six) hours as needed., Disp: 1 each, Rfl: 0 .  atorvastatin (LIPITOR) 20 MG tablet, Take by mouth., Disp: , Rfl:  .  meloxicam (MOBIC) 15 MG tablet, Take by mouth., Disp: , Rfl:  .  metFORMIN (GLUCOPHAGE-XR) 500 MG 24 hr tablet, TAKE TWO TABLETS BY MOUTH TWICE DAILY, Disp: , Rfl:  .  Pseudoeph-Doxylamine-DM-APAP (NYQUIL PO), Take by mouth at bedtime., Disp: , Rfl:  .  pseudoephedrine (SUDAFED) 30 MG tablet, Take 30 mg by mouth at bedtime as needed for congestion., Disp: , Rfl:  .  SYMBICORT 160-4.5 MCG/ACT inhaler, INHALE 2 PUFFS INTO THE LUNGS 2 (TWO) TIMES DAILY. (Patient taking differently: 1 puff at bedtime), Disp: 10.2 Mccarty, Rfl: 0   Garner Nash, DO [facility] 12/15/2018 3:14 PM

## 2018-12-22 ENCOUNTER — Ambulatory Visit: Payer: BC Managed Care – PPO | Admitting: Pulmonary Disease

## 2018-12-22 ENCOUNTER — Other Ambulatory Visit: Payer: Self-pay

## 2018-12-22 ENCOUNTER — Encounter: Payer: Self-pay | Admitting: Pulmonary Disease

## 2018-12-22 VITALS — BP 126/74 | HR 88 | Temp 97.7°F | Ht 69.0 in | Wt 205.4 lb

## 2018-12-22 DIAGNOSIS — G4733 Obstructive sleep apnea (adult) (pediatric): Secondary | ICD-10-CM

## 2018-12-22 NOTE — Patient Instructions (Signed)
Will arrange for home sleep study Will call to arrange for follow up after sleep study reviewed  

## 2018-12-22 NOTE — Progress Notes (Signed)
[facility]  Chief Complaint  Patient presents with  . Shortness of Breath    Constitutional:  BP 126/74 (BP Location: Left Arm, Patient Position: Sitting, Cuff Size: Normal)   Pulse 88   Temp 97.7 F (36.5 C)   Ht 5\' 9"  (1.753 m)   Wt 205 lb 6.4 oz (93.2 kg)   SpO2 97% Comment: on room air  BMI 30.33 kg/m   Past Medical History:  HLD, DM, Asthma, DJD  Brief Summary:  Scott Mccarty is a 58 y.o. male former smoker with obstructive sleep apnea.  He has sleep study from 2012.  Showed mild sleep apnea, but very severe in supine sleep.  Was tried on CPAP, but had difficulty tolerating.  Was seen recently by Dr. Valeta Harms for assessment of dyspnea.  There was concern for nocturnal symptoms of dyspnea and he was advised to have further sleep assessment.  Noted to fall asleep easily when sitting quiet, and has fallen asleep while his wife was talking to him.  He snores and his wife has been worried about his breathing while asleep.  He uses the bathroom several times per night.  Feels tired during the day.  Epworth score is 14 out of 24.   Physical Exam:   Appearance - well kempt   ENMT - clear nasal mucosa, midline nasal  septum, no oral exudates, no LAN, trachea midline, narrow jaw, poor dentition  Respiratory - normal chest wall, normal respiratory effort, no accessory muscle use, no wheeze/rales  CV - s1s2 regular rate and rhythm, no murmurs, no peripheral edema, radial pulses symmetric  GI - soft, non tender, no masses  Lymph - no adenopathy noted in neck and axillary areas  MSK - normal gait  Ext - no cyanosis, clubbing, or joint inflammation noted  Skin - no rashes, lesions, or ulcers  Neuro - normal strength, oriented x 3  Psych - normal mood and affect   Assessment/Plan:   Obstructive sleep apnea. - he continues to have snoring, sleep disruption, daytime sleepiness, and witnessed apnea - I am concerned he still has sleep  apnea - will arrange for home sleep study to assess current status of sleep apnea  Obesity. - discussed how weight can impact sleep and risk for sleep disordered breathing - discussed options to assist with weight loss: combination of diet modification, cardiovascular and strength training exercises  Cardiovascular risk. - had an extensive discussion regarding the adverse health consequences related to untreated sleep disordered breathing - specifically discussed the risks for hypertension, coronary artery disease, cardiac dysrhythmias, cerebrovascular disease, and diabetes - lifestyle modification discussed  Safe driving practices. - discussed how sleep disruption can increase risk of accidents, particularly when driving - safe driving practices were discussed  Therapies for obstructive sleep apnea. - if the sleep study shows significant sleep apnea, then various therapies for treatment were reviewed: CPAP, oral appliance, and surgical interventions  Dyspnea with asthma. - f/u with Dr. Valeta Harms   Patient Instructions  Will arrange for home sleep study Will call to arrange for follow up after sleep study reviewed    A total of  26 minutes were spent face to face with the patient and more than half of that time involved counseling or coordination of care.   Chesley Mires, MD [facility] Pager: [phone] 12/22/2018, 10:14 AM  Flow Sheet     Pulmonary tests:  PFT 10/24/10 >> FEV1 3.27 (95%), FEV1% 73, TLC 6.84 (104%), DLCO 90%  Sleep tests:  PSG 11/13/10 >> AHI 10.5, Supine AHI 87.1, SpO2 low 84%  Cardiac tests:  Echo 11/11/17 >> mild LVH, EF 50 to 55%  Medications:   Allergies as of 12/22/2018      Reactions   Celexa [citalopram]    Hydrocodone Itching   Liquid form.        Medication List       Accurate as of December 22, 2018 10:14 AM. If you have any questions, ask your nurse or doctor.        Albuterol Sulfate 108 (90 Base) MCG/ACT  Aepb Commonly known as: ProAir RespiClick Inhale 2 puffs into the lungs every 6 (six) hours as needed.   atorvastatin 20 MG tablet Commonly known as: LIPITOR Take by mouth.   meloxicam 15 MG tablet Commonly known as: MOBIC Take by mouth.   metFORMIN 500 MG 24 hr tablet Commonly known as: GLUCOPHAGE-XR TAKE TWO TABLETS BY MOUTH TWICE DAILY   NYQUIL PO Take by mouth at bedtime.   pantoprazole 40 MG tablet Commonly known as: Protonix Take 1 tablet (40 mg total) by mouth daily before breakfast.   pseudoephedrine 30 MG tablet Commonly known as: SUDAFED Take 30 mg by mouth at bedtime as needed for congestion.   Spiriva Respimat 2.5 MCG/ACT Aers Generic drug: Tiotropium Bromide Monohydrate Inhale 2 puffs into the lungs daily.   Symbicort 160-4.5 MCG/ACT inhaler Generic drug: budesonide-formoterol INHALE 2 PUFFS INTO THE LUNGS 2 (TWO) TIMES DAILY. What changed: See the new instructions.       Past Surgical History:  He  has a past surgical history that includes Neck surgery (11/2006); Hernia repair (03/2006); Back surgery (10/2009); and NM MYOCAR PERF WALL MOTION (11/14/2010).  Family History:  His family history includes Breast cancer in his sister; Heart disease in his father and maternal grandfather; Lung cancer in his maternal grandmother; Pancreatic cancer in his mother.  Social History:  He  reports that he quit smoking about 8 years ago. His smoking use included cigarettes. He has a 22.00 pack-year smoking history. He has never used smokeless tobacco. He reports current alcohol use. He reports that he does not use drugs.

## 2019-01-13 ENCOUNTER — Other Ambulatory Visit: Payer: Self-pay

## 2019-01-13 ENCOUNTER — Ambulatory Visit: Payer: BC Managed Care – PPO

## 2019-01-13 DIAGNOSIS — G4733 Obstructive sleep apnea (adult) (pediatric): Secondary | ICD-10-CM

## 2019-01-14 ENCOUNTER — Telehealth: Payer: Self-pay | Admitting: Pulmonary Disease

## 2019-01-14 DIAGNOSIS — G4733 Obstructive sleep apnea (adult) (pediatric): Secondary | ICD-10-CM | POA: Diagnosis not present

## 2019-01-14 NOTE — Telephone Encounter (Signed)
HST 01/13/19 >> AHI 6.9, SpO2 low 89%   Please inform him that his sleep study shows mild obstructive sleep apnea.  Please arrange for ROV with me or NP to discuss treatment options.

## 2019-01-25 NOTE — Telephone Encounter (Signed)
Called the patient and made him aware of the sleep study results. Patient voiced understanding. Scheduled to see Dr. Halford Chessman on 02/03/19. Nothing further needed at this time.

## 2019-02-03 ENCOUNTER — Other Ambulatory Visit: Payer: Self-pay

## 2019-02-03 ENCOUNTER — Ambulatory Visit: Payer: BC Managed Care – PPO | Admitting: Pulmonary Disease

## 2019-02-03 ENCOUNTER — Encounter: Payer: Self-pay | Admitting: Pulmonary Disease

## 2019-02-03 VITALS — BP 122/70 | HR 87 | Temp 97.3°F | Ht 69.0 in | Wt 209.4 lb

## 2019-02-03 DIAGNOSIS — Z7189 Other specified counseling: Secondary | ICD-10-CM

## 2019-02-03 DIAGNOSIS — G4733 Obstructive sleep apnea (adult) (pediatric): Secondary | ICD-10-CM | POA: Diagnosis not present

## 2019-02-03 NOTE — Patient Instructions (Signed)
Will arrange for CPAP set up Follow up in 2 months 

## 2019-02-03 NOTE — Progress Notes (Signed)
[facility]  Chief Complaint  Patient presents with  . Follow-up    Constitutional:  BP 122/70 (BP Location: Left Arm, Cuff Size: Normal)   Pulse 87   Temp (!) 97.3 F (36.3 C) (Oral)   Ht 5\' 9"  (1.753 m)   Wt 209 lb 6.4 oz (95 kg)   SpO2 98%   BMI 30.92 kg/m   Past Medical History:  HLD, DM, Asthma, DJD  Brief Summary:  Scott Mccarty is a 58 y.o. male former smoker with obstructive sleep apnea.  He had home sleep study 01/13/19.  Showed mild obstructive sleep apnea.  He has trouble with his teeth and doesn't think he could use oral appliance.  Physical Exam:   Appearance - well kempt   ENMT - no sinus tenderness, no nasal discharge, no oral exudate, narrow jaw, poor dentition  Neck - no masses, trachea midline, no thyromegaly, no elevation in JVP  Respiratory - normal appearance of chest wall, normal respiratory effort w/o accessory muscle use, no dullness on percussion, no wheezing or rales  CV - s1s2 regular rate and rhythm, no murmurs, no peripheral edema, radial pulses symmetric  GI - soft, non tender  Lymph - no adenopathy noted in neck and axillary areas  MSK - normal gait  Ext - no cyanosis, clubbing, or joint inflammation noted  Skin - no rashes, lesions, or ulcers  Neuro - normal strength, oriented x 3  Psych - normal mood and affect   Assessment/Plan:   Obstructive sleep apnea. - sleep study shows mild OSA - therapies for treatment were reviewed: CPAP, oral appliance, and surgical interventions - he is agreeable to try CPAP again - will arrange for auto CPAP set up - discussed options to improve mask fit  Obesity. - reviewed options to assist with weight loss  Dyspnea with asthma. - f/u with Dr. 01/15/19   Patient Instructions  Will arrange for CPAP set up  Follow up in 2 months    Tonia Brooms, MD [facility] Pager: [phone] 02/03/2019, 9:56 AM  Flow Sheet      Pulmonary tests:  PFT 10/24/10 >> FEV1 3.27 (95%), FEV1% 73, TLC 6.84 (104%), DLCO 90%  Sleep tests:  PSG 11/13/10 >> AHI 10.5, Supine AHI 87.1, SpO2 low 84% HST 01/13/19 >> AHI 6.9, SpO2 low 89%  Cardiac tests:  Echo 11/11/17 >> mild LVH, EF 50 to 55%  Medications:   Allergies as of 02/03/2019      Reactions   Celexa [citalopram]    Hydrocodone Itching   Liquid form.        Medication List       Accurate as of February 03, 2019  9:56 AM. If you have any questions, ask your nurse or doctor.        Albuterol Sulfate 108 (90 Base) MCG/ACT Aepb Commonly known as: ProAir RespiClick Inhale 2 puffs into the lungs every 6 (six) hours as needed.   atorvastatin 20 MG tablet Commonly known as: LIPITOR Take by mouth.   meloxicam 15 MG tablet Commonly known as: MOBIC Take by mouth.   metFORMIN 500 MG 24 hr tablet Commonly known as: GLUCOPHAGE-XR TAKE TWO TABLETS BY MOUTH TWICE DAILY   NYQUIL PO Take by mouth at bedtime.   pantoprazole 40 MG tablet Commonly known as: Protonix Take 1 tablet (40 mg total) by mouth daily before breakfast.   pseudoephedrine 30 MG tablet Commonly known as: SUDAFED Take 30 mg by mouth at bedtime  as needed for congestion.   Spiriva Respimat 2.5 MCG/ACT Aers Generic drug: Tiotropium Bromide Monohydrate Inhale 2 puffs into the lungs daily.   Symbicort 160-4.5 MCG/ACT inhaler Generic drug: budesonide-formoterol INHALE 2 PUFFS INTO THE LUNGS 2 (TWO) TIMES DAILY. What changed: See the new instructions.       Past Surgical History:  He  has a past surgical history that includes Neck surgery (11/2006); Hernia repair (03/2006); Back surgery (10/2009); and NM MYOCAR PERF WALL MOTION (11/14/2010).  Family History:  His family history includes Breast cancer in his sister; Heart disease in his father and maternal grandfather; Lung cancer in his maternal grandmother; Pancreatic cancer in his mother.  Social History:  He  reports that he quit  smoking about 8 years ago. His smoking use included cigarettes. He has a 22.00 pack-year smoking history. He has never used smokeless tobacco. He reports current alcohol use. He reports that he does not use drugs.

## 2019-02-11 ENCOUNTER — Other Ambulatory Visit (HOSPITAL_COMMUNITY)
Admission: RE | Admit: 2019-02-11 | Discharge: 2019-02-11 | Disposition: A | Discharge status: Home / Self Care | Payer: BC Managed Care – PPO | Source: Ambulatory Visit | Attending: Pulmonary Disease | Admitting: Pulmonary Disease

## 2019-02-11 DIAGNOSIS — Z20828 Contact with and (suspected) exposure to other viral communicable diseases: Secondary | ICD-10-CM | POA: Insufficient documentation

## 2019-02-11 DIAGNOSIS — Z01812 Encounter for preprocedural laboratory examination: Secondary | ICD-10-CM | POA: Insufficient documentation

## 2019-02-11 LAB — SARS CORONAVIRUS 2 (TAT 6-24 HRS): SARS Coronavirus 2: NEGATIVE

## 2019-02-14 ENCOUNTER — Ambulatory Visit (INDEPENDENT_AMBULATORY_CARE_PROVIDER_SITE_OTHER): Payer: BC Managed Care – PPO | Admitting: Pulmonary Disease

## 2019-02-14 ENCOUNTER — Other Ambulatory Visit: Payer: Self-pay

## 2019-02-14 DIAGNOSIS — R0602 Shortness of breath: Secondary | ICD-10-CM | POA: Diagnosis not present

## 2019-02-14 DIAGNOSIS — K219 Gastro-esophageal reflux disease without esophagitis: Secondary | ICD-10-CM

## 2019-02-14 LAB — PULMONARY FUNCTION TEST
DL/VA % pred: 104 %
DL/VA: 4.43 ml/min/mmHg/L
DLCO unc % pred: 115 %
DLCO unc: 30.7 ml/min/mmHg
FEF 25-75 Post: 2.97 L/sec
FEF 25-75 Pre: 2.83 L/sec
FEF2575-%Change-Post: 4 %
FEF2575-%Pred-Post: 103 %
FEF2575-%Pred-Pre: 98 %
FEV1-%Change-Post: 2 %
FEV1-%Pred-Post: 95 %
FEV1-%Pred-Pre: 93 %
FEV1-Post: 3.3 L
FEV1-Pre: 3.23 L
FEV1FVC-%Change-Post: 0 %
FEV1FVC-%Pred-Pre: 102 %
FEV6-%Change-Post: 2 %
FEV6-%Pred-Post: 97 %
FEV6-%Pred-Pre: 95 %
FEV6-Post: 4.26 L
FEV6-Pre: 4.15 L
FEV6FVC-%Pred-Post: 104 %
FEV6FVC-%Pred-Pre: 104 %
FVC-%Change-Post: 2 %
FVC-%Pred-Post: 93 %
FVC-%Pred-Pre: 90 %
FVC-Post: 4.26 L
FVC-Pre: 4.15 L
Post FEV1/FVC ratio: 77 %
Post FEV6/FVC ratio: 100 %
Pre FEV1/FVC ratio: 78 %
Pre FEV6/FVC Ratio: 100 %
RV % pred: 116 %
RV: 2.53 L
TLC % pred: 106 %
TLC: 7.18 L

## 2019-02-14 NOTE — Progress Notes (Signed)
Full PFT performed today. °

## 2019-02-15 NOTE — Telephone Encounter (Signed)
I called and spoke with the patient and made him aware that Dr. Valeta Harms did not have anything later that day and his next available wasn't until 03/09/19. He took the last appointment that day at 4:30. Nothing further is needed.

## 2019-02-24 ENCOUNTER — Ambulatory Visit: Payer: BC Managed Care – PPO | Admitting: Pulmonary Disease

## 2019-03-09 ENCOUNTER — Encounter: Payer: Self-pay | Admitting: Pulmonary Disease

## 2019-03-09 ENCOUNTER — Ambulatory Visit: Payer: BC Managed Care – PPO | Admitting: Pulmonary Disease

## 2019-03-09 ENCOUNTER — Other Ambulatory Visit: Payer: Self-pay

## 2019-03-09 VITALS — BP 122/76 | HR 102 | Temp 98.0°F | Ht 69.0 in | Wt 211.2 lb

## 2019-03-09 DIAGNOSIS — G4733 Obstructive sleep apnea (adult) (pediatric): Secondary | ICD-10-CM | POA: Diagnosis not present

## 2019-03-09 DIAGNOSIS — Z9989 Dependence on other enabling machines and devices: Secondary | ICD-10-CM

## 2019-03-09 DIAGNOSIS — R0600 Dyspnea, unspecified: Secondary | ICD-10-CM

## 2019-03-09 DIAGNOSIS — R0609 Other forms of dyspnea: Secondary | ICD-10-CM

## 2019-03-09 NOTE — Patient Instructions (Signed)
Thank you for visiting Dr. Tonia Brooms at [facility]. Today we recommend the following:  Keep up the CPAP  Continue to increase you exercise tolerance  Weightloss is a great goal for the year.   Call us as needed.     Please do your part to reduce the spread of COVID-19.

## 2019-03-09 NOTE — Progress Notes (Signed)
Synopsis: Referred in Oct 2020 for SOB by Roger Kill, *  Subjective:   PATIENT ID: Scott Mccarty GENDER: male DOB: 06/28/1960, MRN: [id number]  Chief Complaint  Patient presents with  . Follow-up    6 week f/u SOB. Breathing is the same as last visit.    PMH of DMII, PVC. C/o SOB which has been worse over the past few months. Former smoker, quit 2014, smoked for >30 years, 1-1.5ppd. He has been having worsening symptoms over the past few months. He has been on symbicort for several years. No seasonal allergies. No birds or pets in the home. When he lived in [place] ([place]) he had trouble with allergies. No one else smokes in the home. He had nocturnal symptoms, trouble breathing. He knows if he misses a dose of his inhaler. He never has asthma symptoms as a kid. Previous spirometry had normal fev1 and fvc ratio.  Patient also currently complaining of multiple sleep problems.  Gets up many times in the night.  He has nocturnal gastroesophageal reflux symptoms.  Chest burning.  Routine having to sleep on 2 pillows to keep himself up.  Also wakes up multiple times with trouble breathing.  He has OSA was placed on CPAP several years ago not compliant with CPAP because he feels like he is drowning when he wears it.  He does not have a sleep physician.  Has not followed up regarding any titration needs for this.  He also routinely falls asleep on the couch early in the evening.  He is even been told that he falls asleep when his wife is talking to him.  OV 03/09/2019: Patient seen today for follow-up.  Had pulmonary function tests which revealed normal spirometry mildly elevated DLCO.  Significantly reduced ERV likely related to body habitus.  We reviewed this today in the office.  Patient has no significant complaints he does still have dyspnea when he tries to exert himself.  He does have sleep apnea recently started on CPAP.  See Dr. Craige Cotta in the office.  We appreciate his input.  Overall he is  exercise tolerance is poor.  We discussed this today in the office.  He does get approximately 10,000 steps in at work each day.    Past Medical History:  Diagnosis Date  . Carpal tunnel syndrome   . Chest pain   . Degeneration of cervical intervertebral disc   . Diabetes (HCC)   . Hyperlipidemia   . Obstructive sleep apnea (adult) (pediatric)   . PVC's (premature ventricular contractions)   . Shortness of breath      Family History  Problem Relation Age of Onset  . Pancreatic cancer Mother   . Heart disease Father   . Breast cancer Sister   . Lung cancer Maternal Grandmother   . Heart disease Maternal Grandfather      Past Surgical History:  Procedure Laterality Date  . BACK SURGERY  10/2009  . HERNIA REPAIR  03/2006  . NECK SURGERY  11/2006  . NM MYOCAR PERF WALL MOTION  11/14/2010   normal    Social History   Socioeconomic History  . Marital status: Married    Spouse name: Not on file  . Number of children: Not on file  . Years of education: Not on file  . Highest education level: Not on file  Occupational History  . Occupation: Occupational hygienist  Tobacco Use  . Smoking status: Former Smoker    Packs/day: 1.00    Years:  22.00    Pack years: 22.00    Types: Cigarettes    Quit date: 04/25/2010    Years since quitting: 8.8  . Smokeless tobacco: Never Used  Substance and Sexual Activity  . Alcohol use: Yes    Comment: 3-4 drinks per week  . Drug use: No  . Sexual activity: Not on file  Other Topics Concern  . Not on file  Social History Narrative  . Not on file   Social Determinants of Health   Financial Resource Strain:   . Difficulty of Paying Living Expenses: Not on file  Food Insecurity:   . Worried About Charity fundraiser in the Last Year: Not on file  . Ran Out of Food in the Last Year: Not on file  Transportation Needs:   . Lack of Transportation (Medical): Not on file  . Lack of Transportation (Non-Medical): Not on file  Physical Activity:     . Days of Exercise per Week: Not on file  . Minutes of Exercise per Session: Not on file  Stress:   . Feeling of Stress : Not on file  Social Connections:   . Frequency of Communication with Friends and Family: Not on file  . Frequency of Social Gatherings with Friends and Family: Not on file  . Attends Religious Services: Not on file  . Active Member of Clubs or Organizations: Not on file  . Attends Archivist Meetings: Not on file  . Marital Status: Not on file  Intimate Partner Violence:   . Fear of Current or Ex-Partner: Not on file  . Emotionally Abused: Not on file  . Physically Abused: Not on file  . Sexually Abused: Not on file     Allergies  Allergen Reactions  . Celexa [Citalopram]   . Hydrocodone Itching    Liquid form.       Outpatient Medications Prior to Visit  Medication Sig Dispense Refill  . Albuterol Sulfate (PROAIR RESPICLICK) 009 (90 BASE) MCG/ACT AEPB Inhale 2 puffs into the lungs every 6 (six) hours as needed. 1 each 0  . atorvastatin (LIPITOR) 20 MG tablet Take by mouth.    . meloxicam (MOBIC) 15 MG tablet Take by mouth.    . metFORMIN (GLUCOPHAGE-XR) 500 MG 24 hr tablet TAKE TWO TABLETS BY MOUTH TWICE DAILY    . pantoprazole (PROTONIX) 40 MG tablet Take 1 tablet (40 mg total) by mouth daily before breakfast. 30 tablet 11  . Pseudoeph-Doxylamine-DM-APAP (NYQUIL PO) Take by mouth at bedtime.    . pseudoephedrine (SUDAFED) 30 MG tablet Take 30 mg by mouth at bedtime as needed for congestion.    . SYMBICORT 160-4.5 MCG/ACT inhaler INHALE 2 PUFFS INTO THE LUNGS 2 (TWO) TIMES DAILY. (Patient taking differently: 1 puff at bedtime) 10.2 g 0  . Tiotropium Bromide Monohydrate (SPIRIVA RESPIMAT) 2.5 MCG/ACT AERS Inhale 2 puffs into the lungs daily. 1 g 0   No facility-administered medications prior to visit.    Review of Systems  Constitutional: Negative for chills, fever, malaise/fatigue and weight loss.  HENT: Negative for hearing loss, sore throat  and tinnitus.   Eyes: Negative for blurred vision and double vision.  Respiratory: Positive for shortness of breath. Negative for cough, hemoptysis, sputum production, wheezing and stridor.   Cardiovascular: Negative for chest pain, palpitations, orthopnea, leg swelling and PND.  Gastrointestinal: Negative for abdominal pain, constipation, diarrhea, heartburn, nausea and vomiting.  Genitourinary: Negative for dysuria, hematuria and urgency.  Musculoskeletal: Negative for joint pain and  myalgias.  Skin: Negative for itching and rash.  Neurological: Negative for dizziness, tingling, weakness and headaches.  Endo/Heme/Allergies: Negative for environmental allergies. Does not bruise/bleed easily.  Psychiatric/Behavioral: Negative for depression. The patient is not nervous/anxious and does not have insomnia.   All other systems reviewed and are negative.    Objective:  Physical Exam Vitals reviewed.  Constitutional:      General: He is not in acute distress.    Appearance: He is well-developed. He is obese.  HENT:     Head: Normocephalic and atraumatic.  Eyes:     General: No scleral icterus.    Conjunctiva/sclera: Conjunctivae normal.     Pupils: Pupils are equal, round, and reactive to light.  Neck:     Vascular: No JVD.     Trachea: No tracheal deviation.  Cardiovascular:     Rate and Rhythm: Normal rate and regular rhythm.     Heart sounds: Normal heart sounds. No murmur.  Pulmonary:     Effort: Pulmonary effort is normal. No tachypnea, accessory muscle usage or respiratory distress.     Breath sounds: Normal breath sounds. No stridor. No wheezing, rhonchi or rales.  Abdominal:     General: Bowel sounds are normal. There is no distension.     Palpations: Abdomen is soft.     Tenderness: There is no abdominal tenderness.  Musculoskeletal:        General: No tenderness.     Cervical back: Neck supple.  Lymphadenopathy:     Cervical: No cervical adenopathy.  Skin:     General: Skin is warm and dry.     Capillary Refill: Capillary refill takes less than 2 seconds.     Findings: No rash.  Neurological:     Mental Status: He is alert and oriented to person, place, and time.  Psychiatric:        Behavior: Behavior normal.      Vitals:   03/09/19 1642  BP: 122/76  Pulse: (!) 102  Temp: 98 F (36.7 C)  TempSrc: Temporal  SpO2: 94%  Weight: 211 lb 3.2 oz (95.8 kg)  Height: 5\' 9"  (1.753 m)   94% on RA BMI Readings from Last 3 Encounters:  03/09/19 31.19 kg/m  02/03/19 30.92 kg/m  12/22/18 30.33 kg/m   Wt Readings from Last 3 Encounters:  03/09/19 211 lb 3.2 oz (95.8 kg)  02/03/19 209 lb 6.4 oz (95 kg)  12/22/18 205 lb 6.4 oz (93.2 kg)     CBC    Component Value Date/Time   WBC 8.7 01/27/2008 1215   RBC 4.84 01/27/2008 1215   HGB 15.7 01/27/2008 1215   HCT 46.6 01/27/2008 1215   PLT 186 01/27/2008 1215   MCV 96.2 01/27/2008 1215   MCHC 33.8 01/27/2008 1215   RDW 13.3 01/27/2008 1215     Chest Imaging: 10/02/2017 chest x-ray: No active pulmonary disease. The patient's images have been independently reviewed by me.    Pulmonary Functions Testing Results: PFT Results Latest Ref Rng & Units 02/14/2019  FVC-Pre L 4.15  FVC-Predicted Pre % 90  FVC-Post L 4.26  FVC-Predicted Post % 93  Pre FEV1/FVC % % 78  Post FEV1/FCV % % 77  FEV1-Pre L 3.23  FEV1-Predicted Pre % 93  FEV1-Post L 3.30  DLCO UNC% % 115  DLCO COR %Predicted % 104  TLC L 7.18  TLC % Predicted % 106  RV % Predicted % 116    FeNO: None   Pathology: None  Echocardiogram: None   Heart Catheterization: None     Assessment & Plan:     ICD-10-CM   1. DOE (dyspnea on exertion)  R06.00   2. OSA on CPAP  G47.33    Z99.89      Discussion:  59 year old gentleman with dyspnea on exertion.  Unclear etiology about his bronchial reactivity and potential asthma history.  PFTs are reassuring.  He does not have any significant bronchodilator response but  does have a reduced ERV of 17% and a mildly elevated DLCO 115%.  Both of which can be related to body habitus.  Additionally asthma could cause an elevated DLCO.  He has been using his Symbicort inhaler that he was given last time with no significant improvement.  Patient was therefore counseled on stopping his inhaler to see if his symptoms change and if not he could stay off of this.  Patient was encouraged to increase his exercise tolerance.  He plans to lose some weight this year.  Him and his wife both are starting a regular exercise routine.  I encouraged him to try to increase his exercise threshold to see hopefully if he will not have any symptoms as he becomes more physically active.  His PFTs overall reassuring we discussed this today in the office.  Patient to follow-up with Dr. Craige Cotta regarding his CPAP needs and sleep apnea management.  Patient follow-up with me in clinic as needed or if symptoms change.  Greater than 50% of this patient's 35-minute office was spent face-to-face discussing the above recommendations and treatment plan.  We also reviewed patient's PFTs today in the office.  We also discussed and counseled on importance of weight loss.   Current Outpatient Medications:  .  Albuterol Sulfate (PROAIR RESPICLICK) 108 (90 BASE) MCG/ACT AEPB, Inhale 2 puffs into the lungs every 6 (six) hours as needed., Disp: 1 each, Rfl: 0 .  atorvastatin (LIPITOR) 20 MG tablet, Take by mouth., Disp: , Rfl:  .  meloxicam (MOBIC) 15 MG tablet, Take by mouth., Disp: , Rfl:  .  metFORMIN (GLUCOPHAGE-XR) 500 MG 24 hr tablet, TAKE TWO TABLETS BY MOUTH TWICE DAILY, Disp: , Rfl:  .  pantoprazole (PROTONIX) 40 MG tablet, Take 1 tablet (40 mg total) by mouth daily before breakfast., Disp: 30 tablet, Rfl: 11 .  Pseudoeph-Doxylamine-DM-APAP (NYQUIL PO), Take by mouth at bedtime., Disp: , Rfl:  .  pseudoephedrine (SUDAFED) 30 MG tablet, Take 30 mg by mouth at bedtime as needed for congestion., Disp: , Rfl:   .  SYMBICORT 160-4.5 MCG/ACT inhaler, INHALE 2 PUFFS INTO THE LUNGS 2 (TWO) TIMES DAILY. (Patient taking differently: 1 puff at bedtime), Disp: 10.2 g, Rfl: 0 .  Tiotropium Bromide Monohydrate (SPIRIVA RESPIMAT) 2.5 MCG/ACT AERS, Inhale 2 puffs into the lungs daily., Disp: 1 g, Rfl: 0   Josephine Igo, DO [facility] 03/09/2019 5:07 PM

## 2019-04-25 NOTE — Telephone Encounter (Signed)
Dr. Tonia Brooms, please advise on pt email:   Dr. Tonia Brooms,   I am still having some shortness of breath. My Pulmonary function test came back normal,  but I remembered I was exposed to some significant mold about 9 months ago.  Could this be an issue for me? Your thoughts.   Thanks, Scott Mccarty  [phone]

## 2019-05-02 NOTE — Telephone Encounter (Signed)
Dr. Tonia Brooms, Please advise on patient's response.   Thank you for your reply. I started using my inhaler again but these moments of shortness of breath are sporadic, from sitting doing nothing to getting up for a trip to the refrigerator.  I also get occasional light discomfort in my chest. I don't know what to do at this point. What do you suggest I do?  Thank you..... Scott Mccarty

## 2019-05-11 NOTE — Telephone Encounter (Signed)
Patient scheduled 3:30pm on 06/09/19.

## 2019-06-09 ENCOUNTER — Encounter: Payer: Self-pay | Admitting: Pulmonary Disease

## 2019-06-09 ENCOUNTER — Ambulatory Visit: Payer: BC Managed Care – PPO | Admitting: Pulmonary Disease

## 2019-06-09 ENCOUNTER — Other Ambulatory Visit: Payer: Self-pay

## 2019-06-09 VITALS — BP 120/68 | HR 90 | Temp 97.8°F | Ht 69.0 in | Wt 214.8 lb

## 2019-06-09 DIAGNOSIS — Z87891 Personal history of nicotine dependence: Secondary | ICD-10-CM

## 2019-06-09 DIAGNOSIS — R0600 Dyspnea, unspecified: Secondary | ICD-10-CM | POA: Diagnosis not present

## 2019-06-09 DIAGNOSIS — G4733 Obstructive sleep apnea (adult) (pediatric): Secondary | ICD-10-CM

## 2019-06-09 DIAGNOSIS — R0602 Shortness of breath: Secondary | ICD-10-CM

## 2019-06-09 DIAGNOSIS — Z9989 Dependence on other enabling machines and devices: Secondary | ICD-10-CM

## 2019-06-09 DIAGNOSIS — R0609 Other forms of dyspnea: Secondary | ICD-10-CM

## 2019-06-09 NOTE — Patient Instructions (Addendum)
Thank you for visiting Dr. Tonia Brooms at [facility]. Today we recommend the following:  Continue your current exercise routine.  Referral placed to lung cancer screening program.   Return in about 6 months (around 12/09/2019).    Please do your part to reduce the spread of COVID-19.

## 2019-06-09 NOTE — Progress Notes (Signed)
Synopsis: Referred in Oct 2020 for SOB by Heywood Bene, *  Subjective:   PATIENT ID: Scott Mccarty GENDER: male DOB: 01-12-1961, MRN: [id number]  Chief Complaint  Patient presents with  . Follow-up    Patient reports that his CPAP is doing great. He reports that his breathing is ok and "challenging" at times.    PMH of DMII, PVC. C/o SOB which has been worse over the past few months. Former smoker, quit 2014, smoked for >30 years, 1-1.5ppd. He has been having worsening symptoms over the past few months. He has been on symbicort for several years. No seasonal allergies. No birds or pets in the home. When he lived in [place] ([place]) he had trouble with allergies. No one else smokes in the home. He had nocturnal symptoms, trouble breathing. He knows if he misses a dose of his inhaler. He never has asthma symptoms as a kid. Previous spirometry had normal fev1 and fvc ratio.  Patient also currently complaining of multiple sleep problems.  Gets up many times in the night.  He has nocturnal gastroesophageal reflux symptoms.  Chest burning.  Routine having to sleep on 2 pillows to keep himself up.  Also wakes up multiple times with trouble breathing.  He has OSA was placed on CPAP several years ago not compliant with CPAP because he feels like he is drowning when he wears it.  He does not have a sleep physician.  Has not followed up regarding any titration needs for this.  He also routinely falls asleep on the couch early in the evening.  He is even been told that he falls asleep when his wife is talking to him.  OV 03/09/2019: Patient seen today for follow-up.  Had pulmonary function tests which revealed normal spirometry mildly elevated DLCO.  Significantly reduced ERV likely related to body habitus.  We reviewed this today in the office.  Patient has no significant complaints he does still have dyspnea when he tries to exert himself.  He does have sleep apnea recently started on CPAP.  See Dr. Halford Chessman  in the office.  We appreciate his input.  Overall he is exercise tolerance is poor.  We discussed this today in the office.  He does get approximately 10,000 steps in at work each day.  OV 06/09/2019: Since last office visit patient has followed up with primary care twice.  Was seen in March still with ongoing complaints of shortness of breath.  Appears that he was screened for COVID-19.  He also had follow-up regarding his hyperlipidemia and type 2 diabetes moderate persistent asthma symptoms.  COVID-19 testing 04/25/2019 negative.  At this time patient still has significant dyspnea on exertion.  Unfortunately he has gained 5 additional pounds since he was last seen in the office.    Past Medical History:  Diagnosis Date  . Carpal tunnel syndrome   . Chest pain   . Degeneration of cervical intervertebral disc   . Diabetes ([place])   . Hyperlipidemia   . Obstructive sleep apnea (adult) (pediatric)   . PVC's (premature ventricular contractions)   . Shortness of breath      Family History  Problem Relation Age of Onset  . Pancreatic cancer Mother   . Heart disease Father   . Breast cancer Sister   . Lung cancer Maternal Grandmother   . Heart disease Maternal Grandfather      Past Surgical History:  Procedure Laterality Date  . BACK SURGERY  10/2009  . HERNIA REPAIR  03/2006  . NECK SURGERY  11/2006  . NM MYOCAR PERF WALL MOTION  11/14/2010   normal    Social History   Socioeconomic History  . Marital status: Married    Spouse name: Not on file  . Number of children: Not on file  . Years of education: Not on file  . Highest education level: Not on file  Occupational History  . Occupation: Scientist, research (medical)  Tobacco Use  . Smoking status: Former Smoker    Packs/day: 1.00    Years: 22.00    Pack years: 22.00    Types: Cigarettes    Quit date: 04/25/2010    Years since quitting: 9.1  . Smokeless tobacco: Never Used  Substance and Sexual Activity  . Alcohol use: Yes    Comment:  3-4 drinks per week  . Drug use: No  . Sexual activity: Not on file  Other Topics Concern  . Not on file  Social History Narrative  . Not on file   Social Determinants of Health   Financial Resource Strain:   . Difficulty of Paying Living Expenses:   Food Insecurity:   . Worried About Charity fundraiser in the Last Year:   . Arboriculturist in the Last Year:   Transportation Needs:   . Film/video editor (Medical):   Marland Kitchen Lack of Transportation (Non-Medical):   Physical Activity:   . Days of Exercise per Week:   . Minutes of Exercise per Session:   Stress:   . Feeling of Stress :   Social Connections:   . Frequency of Communication with Friends and Family:   . Frequency of Social Gatherings with Friends and Family:   . Attends Religious Services:   . Active Member of Clubs or Organizations:   . Attends Archivist Meetings:   Marland Kitchen Marital Status:   Intimate Partner Violence:   . Fear of Current or Ex-Partner:   . Emotionally Abused:   Marland Kitchen Physically Abused:   . Sexually Abused:      Allergies  Allergen Reactions  . Celexa [Citalopram]   . Hydrocodone Itching    Liquid form.       Outpatient Medications Prior to Visit  Medication Sig Dispense Refill  . Albuterol Sulfate (PROAIR RESPICLICK) 242 (90 BASE) MCG/ACT AEPB Inhale 2 puffs into the lungs every 6 (six) hours as needed. 1 each 0  . atorvastatin (LIPITOR) 20 MG tablet Take 20 mg by mouth daily.     . meloxicam (MOBIC) 15 MG tablet Take by mouth.    . metFORMIN (GLUCOPHAGE-XR) 500 MG 24 hr tablet TAKE TWO TABLETS BY MOUTH TWICE DAILY    . pantoprazole (PROTONIX) 40 MG tablet Take 1 tablet (40 mg total) by mouth daily before breakfast. 30 tablet 11  . Pseudoeph-Doxylamine-DM-APAP (NYQUIL PO) Take by mouth at bedtime.    . pseudoephedrine (SUDAFED) 30 MG tablet Take 30 mg by mouth at bedtime as needed for congestion.    . SYMBICORT 160-4.5 MCG/ACT inhaler INHALE 2 PUFFS INTO THE LUNGS 2 (TWO) TIMES DAILY.  (Patient taking differently: 1 puff at bedtime) 10.2 g 0  . Tiotropium Bromide Monohydrate (SPIRIVA RESPIMAT) 2.5 MCG/ACT AERS Inhale 2 puffs into the lungs daily. 1 g 0   No facility-administered medications prior to visit.    Review of Systems  Constitutional: Negative for chills, fever, malaise/fatigue and weight loss.  HENT: Negative for hearing loss, sore throat and tinnitus.   Eyes: Negative for blurred vision and  double vision.  Respiratory: Positive for shortness of breath. Negative for cough, hemoptysis, sputum production, wheezing and stridor.   Cardiovascular: Negative for chest pain, palpitations, orthopnea, leg swelling and PND.  Gastrointestinal: Negative for abdominal pain, constipation, diarrhea, heartburn, nausea and vomiting.  Genitourinary: Negative for dysuria, hematuria and urgency.  Musculoskeletal: Negative for joint pain and myalgias.  Skin: Negative for itching and rash.  Neurological: Negative for dizziness, tingling, weakness and headaches.  Endo/Heme/Allergies: Negative for environmental allergies. Does not bruise/bleed easily.  Psychiatric/Behavioral: Negative for depression. The patient is not nervous/anxious and does not have insomnia.   All other systems reviewed and are negative.    Objective:  Physical Exam Vitals reviewed.  Constitutional:      General: He is not in acute distress.    Appearance: He is well-developed. He is obese.  HENT:     Head: Normocephalic and atraumatic.  Eyes:     General: No scleral icterus.    Conjunctiva/sclera: Conjunctivae normal.     Pupils: Pupils are equal, round, and reactive to light.  Neck:     Vascular: No JVD.     Trachea: No tracheal deviation.  Cardiovascular:     Rate and Rhythm: Normal rate and regular rhythm.     Heart sounds: Normal heart sounds. No murmur.  Pulmonary:     Effort: Pulmonary effort is normal. No tachypnea, accessory muscle usage or respiratory distress.     Breath sounds: Normal  breath sounds. No stridor. No wheezing, rhonchi or rales.  Musculoskeletal:        General: No tenderness.     Cervical back: Neck supple.  Lymphadenopathy:     Cervical: No cervical adenopathy.  Skin:    General: Skin is warm and dry.     Capillary Refill: Capillary refill takes less than 2 seconds.     Findings: No rash.  Neurological:     Mental Status: He is alert and oriented to person, place, and time.  Psychiatric:        Behavior: Behavior normal.      Vitals:   06/09/19 1537  BP: 120/68  Pulse: 90  Temp: 97.8 F (36.6 C)  TempSrc: Temporal  SpO2: 96%  Weight: 214 lb 12.8 oz (97.4 kg)  Height: 5' 9"  (1.753 m)   96% on RA BMI Readings from Last 3 Encounters:  06/09/19 31.72 kg/m  03/09/19 31.19 kg/m  02/03/19 30.92 kg/m   Wt Readings from Last 3 Encounters:  06/09/19 214 lb 12.8 oz (97.4 kg)  03/09/19 211 lb 3.2 oz (95.8 kg)  02/03/19 209 lb 6.4 oz (95 kg)     CBC    Component Value Date/Time   WBC 8.7 01/27/2008 1215   RBC 4.84 01/27/2008 1215   HGB 15.7 01/27/2008 1215   HCT 46.6 01/27/2008 1215   PLT 186 01/27/2008 1215   MCV 96.2 01/27/2008 1215   MCHC 33.8 01/27/2008 1215   RDW 13.3 01/27/2008 1215     Chest Imaging: 10/02/2017 chest x-ray: No active pulmonary disease. The patient's images have been independently reviewed by me.    Pulmonary Functions Testing Results: PFT Results Latest Ref Rng & Units 02/14/2019  FVC-Pre L 4.15  FVC-Predicted Pre % 90  FVC-Post L 4.26  FVC-Predicted Post % 93  Pre FEV1/FVC % % 78  Post FEV1/FCV % % 77  FEV1-Pre L 3.23  FEV1-Predicted Pre % 93  FEV1-Post L 3.30  DLCO UNC% % 115  DLCO COR %Predicted % 104  TLC L  7.18  TLC % Predicted % 106  RV % Predicted % 116    FeNO: None   Pathology: None   Echocardiogram: None   Heart Catheterization: None     Assessment & Plan:     ICD-10-CM   1. DOE (dyspnea on exertion)  R06.00   2. SOB (shortness of breath)  R06.02   3. OSA on CPAP   G47.33    Z99.89   4. Former smoker  Z87.891 Ambulatory Referral for Lung Cancer Scre    Discussion:  This is a 59 year old gentleman with progressive undifferentiated dyspnea.  He has had multiple studies for evaluation to include cardiac evaluation perfusion imaging, echocardiogram chest imaging from x-ray in 2019 that was clear.  Symptoms have stayed about the same since we first met over a year ago.  Had PFTs in December 2020 which revealed a low ERV 17% and a mildly elevated DLCO.  He has been using Symbicort morning and evening without much difference in symptomatology.  He does feel like he breathes worse when he is not using it.  He does use CPAP for his OSA which she is moderately compliant for.  Plan: We discussed the next steps for radiology evaluation of dyspnea. He still believes a lot of this is related to his weight and I would agree with this. We offered today to include HRCT imaging of the chest for further evaluation as well as referral to cardiology for cardiopulmonary exercise testing. At this time he would like to hold off and proceed with a weight loss program over the next 6 months to see if this makes any difference. As for his longstanding history of smoking he has smoked greater than 30 years.  He also has family member with history of lung cancer.  Therefore we will enroll in our lung cancer screening program.  This referral was placed additionally this will give [place] an opportunity for evaluation of the lung parenchyma although not an HRCT performed.  Greater than 50% of this patient's 35-minute office was spent face-to-face discussing above recommendations and treatment plan.  59 year old gentleman with dyspnea on exertion.  Unclear etiology about his bronchial reactivity and potential asthma history.  PFTs are reassuring.  He does not have any significant bronchodilator response but does have a reduced ERV of 17% and a mildly elevated DLCO 115%.  Both of which can be  related to body habitus.  Additionally asthma could cause an elevated DLCO.  He has been using his Symbicort inhaler that he was given last time with no significant improvement.  Patient was therefore counseled on stopping his inhaler to see if his symptoms change and if not he could stay off of this.  Patient was encouraged to increase his exercise tolerance.  He plans to lose some weight this year.  Him and his wife both are starting a regular exercise routine.  I encouraged him to try to increase his exercise threshold to see hopefully if he will not have any symptoms as he becomes more physically active.  His PFTs overall reassuring we discussed this today in the office.  Patient to follow-up with Dr. Halford Chessman regarding his CPAP needs and sleep apnea management.  Patient follow-up with me in clinic as needed or if symptoms change.  Greater than 50% of this patient's 35-minute office was spent face-to-face discussing the above recommendations and treatment plan.  We also reviewed patient's PFTs today in the office.  We also discussed and counseled on importance of weight loss.  Current Outpatient Medications:  .  Albuterol Sulfate (PROAIR RESPICLICK) 122 (90 BASE) MCG/ACT AEPB, Inhale 2 puffs into the lungs every 6 (six) hours as needed., Disp: 1 each, Rfl: 0 .  atorvastatin (LIPITOR) 20 MG tablet, Take 20 mg by mouth daily. , Disp: , Rfl:  .  meloxicam (MOBIC) 15 MG tablet, Take by mouth., Disp: , Rfl:  .  metFORMIN (GLUCOPHAGE-XR) 500 MG 24 hr tablet, TAKE TWO TABLETS BY MOUTH TWICE DAILY, Disp: , Rfl:  .  pantoprazole (PROTONIX) 40 MG tablet, Take 1 tablet (40 mg total) by mouth daily before breakfast., Disp: 30 tablet, Rfl: 11 .  Pseudoeph-Doxylamine-DM-APAP (NYQUIL PO), Take by mouth at bedtime., Disp: , Rfl:  .  pseudoephedrine (SUDAFED) 30 MG tablet, Take 30 mg by mouth at bedtime as needed for congestion., Disp: , Rfl:  .  SYMBICORT 160-4.5 MCG/ACT inhaler, INHALE 2 PUFFS INTO THE LUNGS 2  (TWO) TIMES DAILY. (Patient taking differently: 1 puff at bedtime), Disp: 10.2 g, Rfl: 0   Garner Nash, DO [facility] 06/09/2019 3:59 PM

## 2019-06-28 ENCOUNTER — Other Ambulatory Visit: Payer: Self-pay | Admitting: *Deleted

## 2019-06-28 DIAGNOSIS — Z87891 Personal history of nicotine dependence: Secondary | ICD-10-CM

## 2019-07-20 ENCOUNTER — Ambulatory Visit (INDEPENDENT_AMBULATORY_CARE_PROVIDER_SITE_OTHER): Payer: BC Managed Care – PPO | Admitting: Acute Care

## 2019-07-20 ENCOUNTER — Other Ambulatory Visit: Payer: Self-pay

## 2019-07-20 ENCOUNTER — Ambulatory Visit
Admission: RE | Admit: 2019-07-20 | Discharge: 2019-07-20 | Disposition: A | Discharge status: Home / Self Care | Payer: BC Managed Care – PPO | Source: Ambulatory Visit | Attending: Acute Care | Admitting: Acute Care

## 2019-07-20 ENCOUNTER — Encounter: Payer: Self-pay | Admitting: Acute Care

## 2019-07-20 VITALS — BP 124/80 | HR 89 | Ht 69.0 in | Wt 214.0 lb

## 2019-07-20 DIAGNOSIS — Z122 Encounter for screening for malignant neoplasm of respiratory organs: Secondary | ICD-10-CM

## 2019-07-20 DIAGNOSIS — Z87891 Personal history of nicotine dependence: Secondary | ICD-10-CM

## 2019-07-20 NOTE — Patient Instructions (Signed)
Thank you for participating in the [facility] Scre[facility]. It was our pleasure to meet you today. We will call you with the results of your scan within the next few days. Your scan will be assigned a Lung RADS category score by the physicians reading the scans.  This Lung RADS score determines follow up scanning.  See below for description of categories, and follow up screening recommendations. We will be in touch to schedule your follow up screening annually or based on recommendations of our providers. We will fax a copy of your scan results to your Primary Care Physician, or the physician who referred you to the program, to ensure they have the results. Please call the office if you have any questions or concerns regarding your scanning experience or results.  Our office number is [phone]. Please speak with Denise Phelps, RN. She is our Lung Cancer Screening RN. If she is unavailable when you call, please have the office staff send her a message. She will return your call at her earliest convenience. Remember, if your scan is normal, we will scan you annually as long as you continue to meet the criteria for the program. (Age 55-77, Current smoker or smoker who has quit within the last 15 years). If you are a smoker, remember, quitting is the single most powerful action that you can take to decrease your risk of lung cancer and other pulmonary, breathing related problems. We know quitting is hard, and we are here to help.  Please let us know if there is anything we can do to help you meet your goal of quitting. If you are a former smoker, congratulations. We are proud of you! Remain smoke free! Remember you can refer friends or family members through the number above.  We will screen them to make sure they meet criteria for the program. Thank you for helping us take better care of you by participating in Lung Screening.  Lung RADS Categories:  Lung RADS 1: no nodules  or definitely non-concerning nodules.  Recommendation is for a repeat annual scan in 12 months.  Lung RADS 2:  nodules that are non-concerning in appearance and behavior with a very low likelihood of becoming an active cancer. Recommendation is for a repeat annual scan in 12 months.  Lung RADS 3: nodules that are probably non-concerning , includes nodules with a low likelihood of becoming an active cancer.  Recommendation is for a 6-month repeat screening scan. Often noted after an upper respiratory illness. We will be in touch to make sure you have no questions, and to schedule your 6-month scan.  Lung RADS 4 A: nodules with concerning findings, recommendation is most often for a follow up scan in 3 months or additional testing based on our provider's assessment of the scan. We will be in touch to make sure you have no questions and to schedule the recommended 3 month follow up scan.  Lung RADS 4 B:  indicates findings that are concerning. We will be in touch with you to schedule additional diagnostic testing based on our provider's  assessment of the scan.   

## 2019-07-20 NOTE — Progress Notes (Signed)
Shared Decision Making Visit Lung Cancer Screening Program [phone])   Eligibility:  Age 59 y.o.  Pack Years Smoking History Calculation 66 pack year smoking history (# packs/per year x # years smoked)  Recent History of coughing up blood  no  Unexplained weight loss? no ( >Than 15 pounds within the last 6 months )  Prior History Lung / other cancer no (Diagnosis within the last 5 years already requiring surveillance chest CT Scans).  Smoking Status Former Smoker  Former Smokers: Years since quit: 10 years  Quit Date: 2011  Visit Components:  Discussion included one or more decision making aids. yes  Discussion included risk/benefits of screening. yes  Discussion included potential follow up diagnostic testing for abnormal scans. yes  Discussion included meaning and risk of over diagnosis. yes  Discussion included meaning and risk of False Positives. yes  Discussion included meaning of total radiation exposure. yes  Counseling Included:  Importance of adherence to annual lung cancer LDCT screening. yes  Impact of comorbidities on ability to participate in the program. yes  Ability and willingness to under diagnostic treatment. yes  Smoking Cessation Counseling:  Current Smokers:   Discussed importance of smoking cessation. yes  Information about tobacco cessation classes and interventions provided to patient. yes  Patient provided with "ticket" for LDCT Scan. yes  Symptomatic Patient. no  Counseling  Diagnosis Code: Tobacco Use Z72.0  Asymptomatic Patient yes  Counseling (Intermediate counseling: > three minutes counseling) Q7341  Former Smokers:   Discussed the importance of maintaining cigarette abstinence. yes  Diagnosis Code: Personal History of Nicotine Dependence. P37.902  Information about tobacco cessation classes and interventions provided to patient. Yes  Patient provided with "ticket" for LDCT Scan. yes  Written Order for Lung Cancer  Screening with LDCT placed in Epic. Yes (CT Chest Lung Cancer Screening Low Dose W/O CM) [id number] Z12.2-Screening of respiratory organs Z87.891-Personal history of nicotine dependence  I spent 25 minutes of face to face time with [name] discussing the risks and benefits of lung cancer screening. We viewed a power point together that explained in detail the above noted topics. We took the time to pause the power point at intervals to allow for questions to be asked and answered to ensure understanding. We discussed that he had taken the single most powerful action possible to decrease his risk of developing lung cancer when he quit smoking. I counseled him to remain smoke free, and to contact me if he ever had the desire to smoke again so that I can provide resources and tools to help support the effort to remain smoke free. We discussed the time and location of the scan, and that either  [name] RN or I will call with the results within  24-48 hours of receiving them. He has my card and contact information in the event he needs to speak with me, in addition to a copy of the power point we reviewed as a resource. He verbalized understanding of all of the above and had no further questions upon leaving the office.     I explained to the patient that there has been a high incidence of coronary artery disease noted on these exams. I explained that this is a non-gated exam therefore degree or severity cannot be determined. This patient is not on statin therapy. I have asked the patient to follow-up with their PCP regarding any incidental finding of coronary artery disease and management with diet or medication as they feel is clinically  indicated. The patient verbalized understanding of the above and had no further questions.     [name], NP 07/20/2019

## 2019-07-21 ENCOUNTER — Other Ambulatory Visit: Payer: Self-pay | Admitting: *Deleted

## 2019-07-21 DIAGNOSIS — Z87891 Personal history of nicotine dependence: Secondary | ICD-10-CM

## 2019-07-21 NOTE — Progress Notes (Signed)
Please call patient and let them  know their  low dose Ct was read as a Lung RADS 2: nodules that are benign in appearance and behavior with a very low likelihood of becoming a clinically active cancer due to size or lack of growth. Recommendation per radiology is for a repeat LDCT in 12 months. .Please let them  know we will order and schedule their  annual screening scan for 06/2020. Please let them  know there was notation of CAD on their  scan.  Please remind the patient  that this is a non-gated exam therefore degree or severity of disease  cannot be determined. Please have them  follow up with their PCP regarding potential risk factor modification, dietary therapy or pharmacologic therapy if clinically indicated. Pt.  is  currently on statin therapy. Please place order for annual  screening scan for  06/2020 and fax results to PCP. Thanks so much. 

## 2019-08-11 ENCOUNTER — Other Ambulatory Visit: Payer: Self-pay | Admitting: Physician Assistant

## 2019-08-11 ENCOUNTER — Ambulatory Visit
Admission: RE | Admit: 2019-08-11 | Discharge: 2019-08-11 | Disposition: A | Discharge status: Home / Self Care | Payer: BC Managed Care – PPO | Source: Ambulatory Visit | Attending: Physician Assistant | Admitting: Physician Assistant

## 2019-08-11 DIAGNOSIS — M25562 Pain in left knee: Secondary | ICD-10-CM

## 2019-08-11 DIAGNOSIS — G8929 Other chronic pain: Secondary | ICD-10-CM

## 2019-10-12 ENCOUNTER — Other Ambulatory Visit: Payer: Self-pay | Admitting: Orthopedic Surgery

## 2019-10-12 DIAGNOSIS — G8929 Other chronic pain: Secondary | ICD-10-CM

## 2019-10-12 DIAGNOSIS — M25562 Pain in left knee: Secondary | ICD-10-CM

## 2019-11-01 DIAGNOSIS — G4733 Obstructive sleep apnea (adult) (pediatric): Secondary | ICD-10-CM | POA: Diagnosis not present

## 2019-11-02 ENCOUNTER — Other Ambulatory Visit: Payer: BC Managed Care – PPO

## 2019-11-15 ENCOUNTER — Other Ambulatory Visit: Payer: Self-pay

## 2019-11-15 ENCOUNTER — Ambulatory Visit
Admission: RE | Admit: 2019-11-15 | Discharge: 2019-11-15 | Disposition: A | Discharge status: Home / Self Care | Payer: BC Managed Care – PPO | Source: Ambulatory Visit | Attending: Orthopedic Surgery | Admitting: Orthopedic Surgery

## 2019-11-15 DIAGNOSIS — S83282A Other tear of lateral meniscus, current injury, left knee, initial encounter: Secondary | ICD-10-CM | POA: Diagnosis not present

## 2019-11-15 DIAGNOSIS — M25562 Pain in left knee: Secondary | ICD-10-CM

## 2019-11-15 DIAGNOSIS — G8929 Other chronic pain: Secondary | ICD-10-CM

## 2019-11-15 DIAGNOSIS — S83242A Other tear of medial meniscus, current injury, left knee, initial encounter: Secondary | ICD-10-CM | POA: Diagnosis not present

## 2019-12-01 DIAGNOSIS — G4733 Obstructive sleep apnea (adult) (pediatric): Secondary | ICD-10-CM | POA: Diagnosis not present

## 2019-12-14 DIAGNOSIS — M67862 Other specified disorders of synovium, left knee: Secondary | ICD-10-CM | POA: Diagnosis not present

## 2019-12-14 DIAGNOSIS — G8918 Other acute postprocedural pain: Secondary | ICD-10-CM | POA: Diagnosis not present

## 2019-12-14 DIAGNOSIS — M94262 Chondromalacia, left knee: Secondary | ICD-10-CM | POA: Diagnosis not present

## 2019-12-14 DIAGNOSIS — S83272A Complex tear of lateral meniscus, current injury, left knee, initial encounter: Secondary | ICD-10-CM | POA: Diagnosis not present

## 2019-12-14 DIAGNOSIS — Y999 Unspecified external cause status: Secondary | ICD-10-CM | POA: Diagnosis not present

## 2019-12-14 DIAGNOSIS — S83262A Peripheral tear of lateral meniscus, current injury, left knee, initial encounter: Secondary | ICD-10-CM | POA: Diagnosis not present

## 2019-12-14 DIAGNOSIS — S83232A Complex tear of medial meniscus, current injury, left knee, initial encounter: Secondary | ICD-10-CM | POA: Diagnosis not present

## 2019-12-14 DIAGNOSIS — M6752 Plica syndrome, left knee: Secondary | ICD-10-CM | POA: Diagnosis not present

## 2019-12-14 DIAGNOSIS — X58XXXA Exposure to other specified factors, initial encounter: Secondary | ICD-10-CM | POA: Diagnosis not present

## 2019-12-21 DIAGNOSIS — M23307 Other meniscus derangements, unspecified meniscus, left knee: Secondary | ICD-10-CM | POA: Diagnosis not present

## 2019-12-21 DIAGNOSIS — M6281 Muscle weakness (generalized): Secondary | ICD-10-CM | POA: Diagnosis not present

## 2019-12-26 DIAGNOSIS — M23307 Other meniscus derangements, unspecified meniscus, left knee: Secondary | ICD-10-CM | POA: Diagnosis not present

## 2019-12-26 DIAGNOSIS — M6281 Muscle weakness (generalized): Secondary | ICD-10-CM | POA: Diagnosis not present

## 2019-12-29 DIAGNOSIS — M6281 Muscle weakness (generalized): Secondary | ICD-10-CM | POA: Diagnosis not present

## 2019-12-29 DIAGNOSIS — M23307 Other meniscus derangements, unspecified meniscus, left knee: Secondary | ICD-10-CM | POA: Diagnosis not present

## 2020-01-02 DIAGNOSIS — M23307 Other meniscus derangements, unspecified meniscus, left knee: Secondary | ICD-10-CM | POA: Diagnosis not present

## 2020-01-02 DIAGNOSIS — M6281 Muscle weakness (generalized): Secondary | ICD-10-CM | POA: Diagnosis not present

## 2020-01-05 DIAGNOSIS — M6281 Muscle weakness (generalized): Secondary | ICD-10-CM | POA: Diagnosis not present

## 2020-01-05 DIAGNOSIS — M23307 Other meniscus derangements, unspecified meniscus, left knee: Secondary | ICD-10-CM | POA: Diagnosis not present

## 2020-01-11 DIAGNOSIS — M6281 Muscle weakness (generalized): Secondary | ICD-10-CM | POA: Diagnosis not present

## 2020-01-11 DIAGNOSIS — M23307 Other meniscus derangements, unspecified meniscus, left knee: Secondary | ICD-10-CM | POA: Diagnosis not present

## 2020-01-16 DIAGNOSIS — M6281 Muscle weakness (generalized): Secondary | ICD-10-CM | POA: Diagnosis not present

## 2020-01-16 DIAGNOSIS — M23307 Other meniscus derangements, unspecified meniscus, left knee: Secondary | ICD-10-CM | POA: Diagnosis not present

## 2020-01-18 DIAGNOSIS — M6281 Muscle weakness (generalized): Secondary | ICD-10-CM | POA: Diagnosis not present

## 2020-01-18 DIAGNOSIS — M23307 Other meniscus derangements, unspecified meniscus, left knee: Secondary | ICD-10-CM | POA: Diagnosis not present

## 2020-01-24 DIAGNOSIS — M6281 Muscle weakness (generalized): Secondary | ICD-10-CM | POA: Diagnosis not present

## 2020-01-24 DIAGNOSIS — M23307 Other meniscus derangements, unspecified meniscus, left knee: Secondary | ICD-10-CM | POA: Diagnosis not present

## 2020-01-26 DIAGNOSIS — M6281 Muscle weakness (generalized): Secondary | ICD-10-CM | POA: Diagnosis not present

## 2020-01-26 DIAGNOSIS — M23307 Other meniscus derangements, unspecified meniscus, left knee: Secondary | ICD-10-CM | POA: Diagnosis not present

## 2020-02-02 DIAGNOSIS — M23307 Other meniscus derangements, unspecified meniscus, left knee: Secondary | ICD-10-CM | POA: Diagnosis not present

## 2020-02-02 DIAGNOSIS — M6281 Muscle weakness (generalized): Secondary | ICD-10-CM | POA: Diagnosis not present

## 2020-02-09 DIAGNOSIS — M23307 Other meniscus derangements, unspecified meniscus, left knee: Secondary | ICD-10-CM | POA: Diagnosis not present

## 2020-02-09 DIAGNOSIS — M6281 Muscle weakness (generalized): Secondary | ICD-10-CM | POA: Diagnosis not present

## 2020-02-14 DIAGNOSIS — M6281 Muscle weakness (generalized): Secondary | ICD-10-CM | POA: Diagnosis not present

## 2020-02-14 DIAGNOSIS — M23307 Other meniscus derangements, unspecified meniscus, left knee: Secondary | ICD-10-CM | POA: Diagnosis not present

## 2020-02-16 DIAGNOSIS — M23307 Other meniscus derangements, unspecified meniscus, left knee: Secondary | ICD-10-CM | POA: Diagnosis not present

## 2020-02-16 DIAGNOSIS — M6281 Muscle weakness (generalized): Secondary | ICD-10-CM | POA: Diagnosis not present

## 2020-03-05 DIAGNOSIS — R0981 Nasal congestion: Secondary | ICD-10-CM | POA: Diagnosis not present

## 2020-03-05 DIAGNOSIS — U071 COVID-19: Secondary | ICD-10-CM | POA: Diagnosis not present

## 2020-03-05 DIAGNOSIS — R52 Pain, unspecified: Secondary | ICD-10-CM | POA: Diagnosis not present

## 2020-03-05 DIAGNOSIS — R059 Cough, unspecified: Secondary | ICD-10-CM | POA: Diagnosis not present

## 2020-03-23 DIAGNOSIS — Z20822 Contact with and (suspected) exposure to covid-19: Secondary | ICD-10-CM | POA: Diagnosis not present

## 2020-03-23 DIAGNOSIS — Z03818 Encounter for observation for suspected exposure to other biological agents ruled out: Secondary | ICD-10-CM | POA: Diagnosis not present

## 2020-06-06 DIAGNOSIS — M791 Myalgia, unspecified site: Secondary | ICD-10-CM | POA: Diagnosis not present

## 2020-07-20 ENCOUNTER — Other Ambulatory Visit: Payer: Self-pay

## 2020-07-20 ENCOUNTER — Ambulatory Visit
Admission: RE | Admit: 2020-07-20 | Discharge: 2020-07-20 | Disposition: A | Discharge status: Home / Self Care | Payer: BC Managed Care – PPO | Source: Ambulatory Visit | Attending: Acute Care | Admitting: Acute Care

## 2020-07-20 DIAGNOSIS — Z87891 Personal history of nicotine dependence: Secondary | ICD-10-CM

## 2020-07-20 DIAGNOSIS — J432 Centrilobular emphysema: Secondary | ICD-10-CM | POA: Diagnosis not present

## 2020-07-20 DIAGNOSIS — I2510 Atherosclerotic heart disease of native coronary artery without angina pectoris: Secondary | ICD-10-CM | POA: Diagnosis not present

## 2020-07-20 DIAGNOSIS — I708 Atherosclerosis of other arteries: Secondary | ICD-10-CM | POA: Diagnosis not present

## 2020-08-02 NOTE — Progress Notes (Signed)
Please call patient and let them  know their  low dose Ct was read as a Lung RADS 2: nodules that are benign in appearance and behavior with a very low likelihood of becoming a clinically active cancer due to size or lack of growth. Recommendation per radiology is for a repeat LDCT in 12 months. .Please let them  know we will order and schedule their  annual screening scan for 06/2021. Please let them  know there was notation of CAD on their  scan.  Please remind the patient  that this is a non-gated exam therefore degree or severity of disease  cannot be determined. Please have them  follow up with their PCP regarding potential risk factor modification, dietary therapy or pharmacologic therapy if clinically indicated. Pt.  is  currently on statin therapy. Please place order for annual  screening scan for  06/2021 and fax results to PCP. Thanks so much.  + for 2 vessel CAD, he is on a statin + for Hepatic steatosis. This  is a term that describes the build up of fat in the liver. It is normal to have small amounts of fat in your liver, but when the proportion of liver cells that contain fat exceeds more than 5% it is indicative of early stage fatty liver.Treatment often involves reducing risk factors through a diet and exercise plan. It is generally a benign condition, but in a small percentage of patients it does require follow up. Please have the patient follow up with PCP regarding potential risk factor modification, dietary therapy or pharmacologic therapy if clinically indicated.

## 2020-08-07 ENCOUNTER — Encounter: Payer: Self-pay | Admitting: *Deleted

## 2020-08-07 DIAGNOSIS — Z87891 Personal history of nicotine dependence: Secondary | ICD-10-CM

## 2020-10-22 DIAGNOSIS — R5383 Other fatigue: Secondary | ICD-10-CM | POA: Diagnosis not present

## 2020-10-22 DIAGNOSIS — R0602 Shortness of breath: Secondary | ICD-10-CM | POA: Diagnosis not present

## 2020-10-22 DIAGNOSIS — R000 Tachycardia, unspecified: Secondary | ICD-10-CM | POA: Diagnosis not present

## 2020-10-22 DIAGNOSIS — M7989 Other specified soft tissue disorders: Secondary | ICD-10-CM | POA: Diagnosis not present

## 2020-10-24 ENCOUNTER — Other Ambulatory Visit: Payer: Self-pay | Admitting: Physician Assistant

## 2020-10-24 DIAGNOSIS — M7989 Other specified soft tissue disorders: Secondary | ICD-10-CM

## 2020-10-25 DIAGNOSIS — R9439 Abnormal result of other cardiovascular function study: Secondary | ICD-10-CM | POA: Diagnosis not present

## 2020-10-30 ENCOUNTER — Other Ambulatory Visit: Payer: Self-pay | Admitting: Physician Assistant

## 2020-10-30 DIAGNOSIS — M7989 Other specified soft tissue disorders: Secondary | ICD-10-CM

## 2020-10-30 DIAGNOSIS — M6281 Muscle weakness (generalized): Secondary | ICD-10-CM | POA: Diagnosis not present

## 2020-10-30 DIAGNOSIS — G8929 Other chronic pain: Secondary | ICD-10-CM | POA: Diagnosis not present

## 2020-10-30 DIAGNOSIS — I872 Venous insufficiency (chronic) (peripheral): Secondary | ICD-10-CM

## 2020-10-30 DIAGNOSIS — M25561 Pain in right knee: Secondary | ICD-10-CM | POA: Diagnosis not present

## 2020-10-31 ENCOUNTER — Other Ambulatory Visit: Payer: BC Managed Care – PPO

## 2020-11-01 ENCOUNTER — Ambulatory Visit
Admission: RE | Admit: 2020-11-01 | Discharge: 2020-11-01 | Disposition: A | Discharge status: Home / Self Care | Payer: BC Managed Care – PPO | Source: Ambulatory Visit | Attending: Physician Assistant | Admitting: Physician Assistant

## 2020-11-01 DIAGNOSIS — R2243 Localized swelling, mass and lump, lower limb, bilateral: Secondary | ICD-10-CM | POA: Diagnosis not present

## 2020-11-01 DIAGNOSIS — M6281 Muscle weakness (generalized): Secondary | ICD-10-CM | POA: Diagnosis not present

## 2020-11-01 DIAGNOSIS — M7989 Other specified soft tissue disorders: Secondary | ICD-10-CM

## 2020-11-01 DIAGNOSIS — G8929 Other chronic pain: Secondary | ICD-10-CM | POA: Diagnosis not present

## 2020-11-01 DIAGNOSIS — I872 Venous insufficiency (chronic) (peripheral): Secondary | ICD-10-CM

## 2020-11-01 DIAGNOSIS — M25561 Pain in right knee: Secondary | ICD-10-CM | POA: Diagnosis not present

## 2020-11-01 NOTE — Consult Note (Signed)
Chief Complaint:  Lower extremity swelling, concern for venous insufficiency  Referring Physician(s): Williams,Breejante J  History of Present Illness: Scott Mccarty is a 60 y.o. male with a chief complaint of lower extremity swelling, worse on the right.  He states that the lower extremity swelling began after having COVID in January.  This is slowly progressed over the last several months and is worse throughout the day.  Review of his venous history performed.  He has had no prior treatments for varicose veins or spider veins.  No history of DVT or superficial thrombosis/thrombophlebitis.  No significant family history of venous disease.  He does not wear any prescription strength or over-the-counter compression stockings.  He does stand for approximately 5 hours a day at his job.  He has been taking gabapentin for pain relief.  Elevation does not particular alleviate any symptoms.  Right leg is more affected than the left with mainly peripheral edema and pain.  Minor associated leg fatigue.  Past Medical History:  Diagnosis Date   Carpal tunnel syndrome    Chest pain    Degeneration of cervical intervertebral disc    Diabetes (HCC)    Hyperlipidemia    Obstructive sleep apnea (adult) (pediatric)    PVC's (premature ventricular contractions)    Shortness of breath     Past Surgical History:  Procedure Laterality Date   BACK SURGERY  10/2009   HERNIA REPAIR  03/2006   NECK SURGERY  11/2006   NM MYOCAR PERF WALL MOTION  11/14/2010   normal    Allergies: Celexa [citalopram] and Hydrocodone  Medications: Prior to Admission medications   Medication Sig Start Date End Date Taking? Authorizing Provider  Albuterol Sulfate (PROAIR RESPICLICK) 108 (90 BASE) MCG/ACT AEPB Inhale 2 puffs into the lungs every 6 (six) hours as needed. 01/11/14   ClanceMaree Krabbe, MD  atorvastatin (LIPITOR) 20 MG tablet Take 20 mg by mouth daily.  11/05/18   [provider]  meloxicam (MOBIC) 15  MG tablet Take by mouth. 10/28/17   [provider]  metFORMIN (GLUCOPHAGE-XR) 500 MG 24 hr tablet TAKE TWO TABLETS BY MOUTH TWICE DAILY 12/12/16   [provider]  pantoprazole (PROTONIX) 40 MG tablet Take 1 tablet (40 mg total) by mouth daily before breakfast. 12/15/18   Icard, Elige Radon L, DO  Pseudoeph-Doxylamine-DM-APAP (NYQUIL PO) Take by mouth at bedtime.    [provider]  pseudoephedrine (SUDAFED) 30 MG tablet Take 30 mg by mouth at bedtime as needed for congestion.    [provider]  SYMBICORT 160-4.5 MCG/ACT inhaler INHALE 2 PUFFS INTO THE LUNGS 2 (TWO) TIMES DAILY. Patient taking differently: 1 puff at bedtime 09/05/13   Clance, Maree Krabbe, MD     Family History  Problem Relation Age of Onset   Pancreatic cancer Mother    Heart disease Father    Breast cancer Sister    Lung cancer Maternal Grandmother    Heart disease Maternal Grandfather     Social History   Socioeconomic History   Marital status: Married    Spouse name: Not on file   Number of children: Not on file   Years of education: Not on file   Highest education level: Not on file  Occupational History   Occupation: Occupational hygienist  Tobacco Use   Smoking status: Former    Packs/day: 2.00    Years: 30.00    Pack years: 60.00    Types: Cigarettes    Quit date: 04/25/2010  Years since quitting: 10.5   Smokeless tobacco: Never  Substance and Sexual Activity   Alcohol use: Yes    Comment: 3-4 drinks per week   Drug use: No   Sexual activity: Not on file  Other Topics Concern   Not on file  Social History Narrative   Not on file   Social Determinants of Health   Financial Resource Strain: Not on file  Food Insecurity: Not on file  Transportation Needs: Not on file  Physical Activity: Not on file  Stress: Not on file  Social Connections: Not on file     Review of Systems: A 12 point ROS discussed and pertinent positives are indicated in the HPI above.  All other  systems are negative.  Review of Systems  Vital Signs: BP (!) 146/84 (BP Location: Left Arm)   Pulse 90   SpO2 96%   Physical Exam Constitutional:      General: He is not in acute distress.    Appearance: He is not toxic-appearing or diaphoretic.  Eyes:     General: No scleral icterus.    Conjunctiva/sclera: Conjunctivae normal.  Musculoskeletal:        General: No swelling, tenderness or deformity.     Comments: No significant visible subsurface varicosities.  Minor calf and ankle edema.  Few scattered bilateral spider veins.  Skin:    General: Skin is warm and dry.     Coloration: Skin is not jaundiced.     Findings: No bruising or lesion.  Neurological:     General: No focal deficit present.  Psychiatric:        Mood and Affect: Mood normal.        Thought Content: Thought content normal.     Imaging: US Venous Img Lower Bilateral (DVT)  Result Date: 11/01/2020 CLINICAL DATA:  Right greater than left lower extremity swelling, post COVID, concern for venous insufficiency and varicose veins EXAM: BILATERAL LOWER EXTREMITY VENOUS DOPPLER ULTRASOUND TECHNIQUE: Gray-scale sonography with graded compression, as well as color Doppler and duplex ultrasound were performed to evaluate the lower extremity deep venous systems from the level of the common femoral vein and including the common femoral, femoral, profunda femoral, popliteal and calf veins including the posterior tibial, peroneal and gastrocnemius veins when visible. The superficial great saphenous vein was also interrogated. Spectral Doppler was utilized to evaluate flow at rest and with distal augmentation maneuvers in the common femoral, femoral and popliteal veins. COMPARISON:  None. FINDINGS: RIGHT LOWER EXTREMITY Common Femoral Vein: No evidence of thrombus. Normal compressibility, respiratory phasicity and response to augmentation. Saphenofemoral Junction: No evidence of thrombus. Normal compressibility and flow on color  Doppler imaging. Negative for venous insufficiency or reflux. Profunda Femoral Vein: No evidence of thrombus. Normal compressibility and flow on color Doppler imaging. Femoral Vein: No evidence of thrombus. Normal compressibility, respiratory phasicity and response to augmentation. Popliteal Vein: No evidence of thrombus. Normal compressibility, respiratory phasicity and response to augmentation. Calf Veins: No evidence of thrombus. Normal compressibility and flow on color Doppler imaging. Superficial Great Saphenous Vein: No evidence of thrombus. Normal compressibility. Negative for venous insufficiency or reflux. Small saphenous vein: Negative for thrombus. Normal compressibility. Negative for venous insufficiency or reflux. Venous Reflux:  None. Other Findings:  No sub surface varicosities demonstrated. LEFT LOWER EXTREMITY Common Femoral Vein: No evidence of thrombus. Normal compressibility, respiratory phasicity and response to augmentation. Saphenofemoral Junction: No evidence of thrombus. Normal compressibility and flow on color Doppler imaging. Negative for venous insufficiency or  reflux. Profunda Femoral Vein: No evidence of thrombus. Normal compressibility and flow on color Doppler imaging. Femoral Vein: No evidence of thrombus. Normal compressibility, respiratory phasicity and response to augmentation. Popliteal Vein: No evidence of thrombus. Normal compressibility, respiratory phasicity and response to augmentation. Calf Veins: No evidence of thrombus. Normal compressibility and flow on color Doppler imaging. Superficial Great Saphenous Vein: No evidence of thrombus. Normal compressibility. Negative for venous insufficiency or reflux. Small saphenous vein: Negative for thrombus. Normal compressibility. Negative for reflux or venous insufficiency. Venous Reflux:  None. Other Findings:  No sub surface varicosities. IMPRESSION: No evidence of deep venous thrombosis in either lower extremity. Normal  saphenous venous anatomy. Negative for venous insufficiency or reflux. No sub surface varicosities. Electronically Signed   By: Judie Petit.  Krislynn Gronau M.D.   On: 11/01/2020 09:42   [place] RAD EVAL AND MGMT  Result Date: 11/01/2020 Please refer to "Notes" to see consult details.   Labs:  CBC: No results for input(s): WBC, HGB, HCT, PLT in the last 8760 hours.  COAGS: No results for input(s): INR, APTT in the last 8760 hours.  BMP: No results for input(s): NA, K, CL, CO2, GLUCOSE, BUN, CALCIUM, CREATININE, GFRNONAA, GFRAA in the last 8760 hours.  Invalid input(s): CMP  LIVER FUNCTION TESTS: No results for input(s): BILITOT, AST, ALT, ALKPHOS, PROT, ALBUMIN in the last 8760 hours.   Assessment and Plan:  Negative for superficial saphenous venous insufficiency, reflux, or any visible subsurface varicosities by ultrasound.  Negative for DVT.  Normal deep and superficial lower extremity venous anatomy.  Plan: Recommend daily walking regimen and over-the-counter compression stockings as needed.  Follow-up for any developing visible varicosities.  Thank you for this interesting consult.  I greatly enjoyed meeting Scott Mccarty and look forward to participating in their care.  A copy of this report was sent to the requesting provider on this date.  Electronically Signed: Berdine Dance 11/01/2020, 9:45 AM   I spent a total of  60 Minutes in face to face in clinical consultation, greater than 50% of which was counseling/coordinating care for this this patient with nonspecific lower extremity mild edema

## 2020-11-05 DIAGNOSIS — E119 Type 2 diabetes mellitus without complications: Secondary | ICD-10-CM | POA: Diagnosis not present

## 2020-11-05 DIAGNOSIS — E782 Mixed hyperlipidemia: Secondary | ICD-10-CM | POA: Diagnosis not present

## 2020-11-06 DIAGNOSIS — M25561 Pain in right knee: Secondary | ICD-10-CM | POA: Diagnosis not present

## 2020-11-06 DIAGNOSIS — M6281 Muscle weakness (generalized): Secondary | ICD-10-CM | POA: Diagnosis not present

## 2020-11-06 DIAGNOSIS — G8929 Other chronic pain: Secondary | ICD-10-CM | POA: Diagnosis not present

## 2020-11-13 DIAGNOSIS — G8929 Other chronic pain: Secondary | ICD-10-CM | POA: Diagnosis not present

## 2020-11-13 DIAGNOSIS — M25561 Pain in right knee: Secondary | ICD-10-CM | POA: Diagnosis not present

## 2020-11-13 DIAGNOSIS — M6281 Muscle weakness (generalized): Secondary | ICD-10-CM | POA: Diagnosis not present

## 2020-11-16 DIAGNOSIS — M6281 Muscle weakness (generalized): Secondary | ICD-10-CM | POA: Diagnosis not present

## 2020-11-16 DIAGNOSIS — G8929 Other chronic pain: Secondary | ICD-10-CM | POA: Diagnosis not present

## 2020-11-16 DIAGNOSIS — M25561 Pain in right knee: Secondary | ICD-10-CM | POA: Diagnosis not present

## 2020-11-19 DIAGNOSIS — M791 Myalgia, unspecified site: Secondary | ICD-10-CM | POA: Diagnosis not present

## 2020-11-19 DIAGNOSIS — E119 Type 2 diabetes mellitus without complications: Secondary | ICD-10-CM | POA: Diagnosis not present

## 2020-11-19 DIAGNOSIS — E782 Mixed hyperlipidemia: Secondary | ICD-10-CM | POA: Diagnosis not present

## 2020-11-19 DIAGNOSIS — J4540 Moderate persistent asthma, uncomplicated: Secondary | ICD-10-CM | POA: Diagnosis not present

## 2020-11-20 DIAGNOSIS — M6281 Muscle weakness (generalized): Secondary | ICD-10-CM | POA: Diagnosis not present

## 2020-11-20 DIAGNOSIS — G8929 Other chronic pain: Secondary | ICD-10-CM | POA: Diagnosis not present

## 2020-11-20 DIAGNOSIS — M25561 Pain in right knee: Secondary | ICD-10-CM | POA: Diagnosis not present

## 2020-11-22 DIAGNOSIS — M25561 Pain in right knee: Secondary | ICD-10-CM | POA: Diagnosis not present

## 2020-11-22 DIAGNOSIS — M6281 Muscle weakness (generalized): Secondary | ICD-10-CM | POA: Diagnosis not present

## 2020-11-22 DIAGNOSIS — G8929 Other chronic pain: Secondary | ICD-10-CM | POA: Diagnosis not present

## 2020-11-27 DIAGNOSIS — M6281 Muscle weakness (generalized): Secondary | ICD-10-CM | POA: Diagnosis not present

## 2020-11-27 DIAGNOSIS — G8929 Other chronic pain: Secondary | ICD-10-CM | POA: Diagnosis not present

## 2020-11-27 DIAGNOSIS — M25561 Pain in right knee: Secondary | ICD-10-CM | POA: Diagnosis not present

## 2020-11-29 DIAGNOSIS — G8929 Other chronic pain: Secondary | ICD-10-CM | POA: Diagnosis not present

## 2020-11-29 DIAGNOSIS — M6281 Muscle weakness (generalized): Secondary | ICD-10-CM | POA: Diagnosis not present

## 2020-11-29 DIAGNOSIS — M25561 Pain in right knee: Secondary | ICD-10-CM | POA: Diagnosis not present

## 2020-12-13 DIAGNOSIS — G8929 Other chronic pain: Secondary | ICD-10-CM | POA: Diagnosis not present

## 2020-12-13 DIAGNOSIS — M25561 Pain in right knee: Secondary | ICD-10-CM | POA: Diagnosis not present

## 2020-12-13 DIAGNOSIS — M6281 Muscle weakness (generalized): Secondary | ICD-10-CM | POA: Diagnosis not present

## 2020-12-18 DIAGNOSIS — M6281 Muscle weakness (generalized): Secondary | ICD-10-CM | POA: Diagnosis not present

## 2020-12-18 DIAGNOSIS — M25561 Pain in right knee: Secondary | ICD-10-CM | POA: Diagnosis not present

## 2020-12-18 DIAGNOSIS — G8929 Other chronic pain: Secondary | ICD-10-CM | POA: Diagnosis not present

## 2020-12-20 DIAGNOSIS — G8929 Other chronic pain: Secondary | ICD-10-CM | POA: Diagnosis not present

## 2020-12-20 DIAGNOSIS — M25561 Pain in right knee: Secondary | ICD-10-CM | POA: Diagnosis not present

## 2020-12-20 DIAGNOSIS — M6281 Muscle weakness (generalized): Secondary | ICD-10-CM | POA: Diagnosis not present

## 2020-12-23 ENCOUNTER — Other Ambulatory Visit: Payer: Self-pay

## 2020-12-23 ENCOUNTER — Emergency Department (HOSPITAL_BASED_OUTPATIENT_CLINIC_OR_DEPARTMENT_OTHER): Payer: BC Managed Care – PPO

## 2020-12-23 ENCOUNTER — Encounter (HOSPITAL_BASED_OUTPATIENT_CLINIC_OR_DEPARTMENT_OTHER): Payer: Self-pay | Admitting: Obstetrics and Gynecology

## 2020-12-23 ENCOUNTER — Emergency Department (HOSPITAL_BASED_OUTPATIENT_CLINIC_OR_DEPARTMENT_OTHER)
Admission: EM | Admit: 2020-12-23 | Discharge: 2020-12-23 | Disposition: A | Discharge status: Home / Self Care | Payer: BC Managed Care – PPO | Attending: Emergency Medicine | Admitting: Emergency Medicine

## 2020-12-23 DIAGNOSIS — Z87891 Personal history of nicotine dependence: Secondary | ICD-10-CM | POA: Diagnosis not present

## 2020-12-23 DIAGNOSIS — I700 Atherosclerosis of aorta: Secondary | ICD-10-CM | POA: Diagnosis not present

## 2020-12-23 DIAGNOSIS — N200 Calculus of kidney: Secondary | ICD-10-CM

## 2020-12-23 DIAGNOSIS — E860 Dehydration: Secondary | ICD-10-CM

## 2020-12-23 DIAGNOSIS — R109 Unspecified abdominal pain: Secondary | ICD-10-CM | POA: Diagnosis not present

## 2020-12-23 DIAGNOSIS — Z79899 Other long term (current) drug therapy: Secondary | ICD-10-CM | POA: Diagnosis not present

## 2020-12-23 DIAGNOSIS — N179 Acute kidney failure, unspecified: Secondary | ICD-10-CM | POA: Diagnosis not present

## 2020-12-23 DIAGNOSIS — E119 Type 2 diabetes mellitus without complications: Secondary | ICD-10-CM | POA: Diagnosis not present

## 2020-12-23 DIAGNOSIS — Z7984 Long term (current) use of oral hypoglycemic drugs: Secondary | ICD-10-CM | POA: Diagnosis not present

## 2020-12-23 DIAGNOSIS — N202 Calculus of kidney with calculus of ureter: Secondary | ICD-10-CM | POA: Diagnosis not present

## 2020-12-23 LAB — CBC WITH DIFFERENTIAL/PLATELET
Abs Immature Granulocytes: 0.02 10*3/uL (ref 0.00–0.07)
Basophils Absolute: 0 10*3/uL (ref 0.0–0.1)
Basophils Relative: 1 %
Eosinophils Absolute: 0.1 10*3/uL (ref 0.0–0.5)
Eosinophils Relative: 2 %
HCT: 44.6 % (ref 39.0–52.0)
Hemoglobin: 15.3 g/dL (ref 13.0–17.0)
Immature Granulocytes: 0 %
Lymphocytes Relative: 17 %
Lymphs Abs: 1 10*3/uL (ref 0.7–4.0)
MCH: 32.3 pg (ref 26.0–34.0)
MCHC: 34.3 g/dL (ref 30.0–36.0)
MCV: 94.1 fL (ref 80.0–100.0)
Monocytes Absolute: 0.7 10*3/uL (ref 0.1–1.0)
Monocytes Relative: 11 %
Neutro Abs: 4.3 10*3/uL (ref 1.7–7.7)
Neutrophils Relative %: 69 %
Platelets: 173 10*3/uL (ref 150–400)
RBC: 4.74 MIL/uL (ref 4.22–5.81)
RDW: 12.8 % (ref 11.5–15.5)
WBC: 6.2 10*3/uL (ref 4.0–10.5)
nRBC: 0 % (ref 0.0–0.2)

## 2020-12-23 LAB — COMPREHENSIVE METABOLIC PANEL
ALT: 39 U/L (ref 0–44)
AST: 22 U/L (ref 15–41)
Albumin: 4.5 g/dL (ref 3.5–5.0)
Alkaline Phosphatase: 63 U/L (ref 38–126)
Anion gap: 12 (ref 5–15)
BUN: 21 mg/dL (ref 8–23)
CO2: 24 mmol/L (ref 22–32)
Calcium: 9.6 mg/dL (ref 8.9–10.3)
Chloride: 103 mmol/L (ref 98–111)
Creatinine, Ser: 1.83 mg/dL — ABNORMAL HIGH (ref 0.61–1.24)
GFR, Estimated: 41 mL/min — ABNORMAL LOW (ref >60)
Glucose, Bld: 113 mg/dL — ABNORMAL HIGH (ref 70–99)
Potassium: 4.3 mmol/L (ref 3.5–5.1)
Sodium: 139 mmol/L (ref 135–145)
Total Bilirubin: 1.1 mg/dL (ref 0.3–1.2)
Total Protein: 7.2 g/dL (ref 6.5–8.1)

## 2020-12-23 LAB — URINALYSIS, ROUTINE W REFLEX MICROSCOPIC
Bilirubin Urine: NEGATIVE
Glucose, UA: NEGATIVE mg/dL
Hgb urine dipstick: NEGATIVE
Ketones, ur: 40 mg/dL — AB
Leukocytes,Ua: NEGATIVE
Nitrite: NEGATIVE
Protein, ur: NEGATIVE mg/dL
Specific Gravity, Urine: 1.021 (ref 1.005–1.030)
pH: 5 (ref 5.0–8.0)

## 2020-12-23 LAB — LIPASE, BLOOD: Lipase: 38 U/L (ref 11–51)

## 2020-12-23 MED ORDER — ONDANSETRON HCL 4 MG/2ML IJ SOLN
4.0000 mg | Freq: Once | INTRAMUSCULAR | Status: AC
  Administered 2020-12-23: 4 mg via INTRAVENOUS
  Filled 2020-12-23: qty 2

## 2020-12-23 MED ORDER — MORPHINE SULFATE (PF) 4 MG/ML IV SOLN
4.0000 mg | Freq: Once | INTRAVENOUS | Status: DC
  Filled 2020-12-23: qty 1

## 2020-12-23 MED ORDER — MORPHINE SULFATE (PF) 4 MG/ML IV SOLN
4.0000 mg | Freq: Once | INTRAVENOUS | Status: AC
  Administered 2020-12-23: 4 mg via INTRAVENOUS
  Filled 2020-12-23: qty 1

## 2020-12-23 MED ORDER — HYDROCODONE-ACETAMINOPHEN 5-325 MG PO TABS: 1.0000 | ORAL_TABLET | ORAL | 0 refills | Status: DC | PRN

## 2020-12-23 MED ORDER — TAMSULOSIN HCL 0.4 MG PO CAPS: 0.4000 mg | ORAL_CAPSULE | Freq: Every day | ORAL | 0 refills | Status: DC

## 2020-12-23 MED ORDER — SODIUM CHLORIDE 0.9 % IV BOLUS
1000.0000 mL | Freq: Once | INTRAVENOUS | Status: AC
  Administered 2020-12-23: 1000 mL via INTRAVENOUS

## 2020-12-23 MED ORDER — KETOROLAC TROMETHAMINE 15 MG/ML IJ SOLN
15.0000 mg | Freq: Once | INTRAMUSCULAR | Status: AC
  Administered 2020-12-23: 15 mg via INTRAVENOUS
  Filled 2020-12-23: qty 1

## 2020-12-23 MED ORDER — SODIUM CHLORIDE 0.9 % IV BOLUS: 500.0000 mL | Freq: Once | INTRAVENOUS | Status: DC

## 2020-12-23 NOTE — Discharge Instructions (Addendum)
Please follow up with your pcp for repeat metabolic panel in 3 days

## 2020-12-23 NOTE — ED Notes (Signed)
Dc instructions reviewed with patient/voiced understanding. Dc with belongings.

## 2020-12-23 NOTE — ED Provider Notes (Signed)
[facility] EMERGENCY DEPT Provider Note   CSN: [id number] Arrival date & time: 12/23/20  5329     History Chief Complaint  Patient presents with   Flank Pain    Scott Mccarty is a 60 y.o. male.  This is a 60 y.o. male with significant medical history as below, including DM, HLD who presents to the ED with complaint of left sided flank pain  Location:  left flank Duration:  1 day Onset:  sudden Timing:  intermittent Description:  sharp, stabbing Severity:  mild Exacerbating/Alleviating Factors:  unable to find comfortable position Associated Symptoms:  decreased urination/dribbling, left flank pain with some groin pain, nausea Pertinent Negatives:  no fevers, vomiting, sick contacts, recent travel, no suspicious oral intake  Context: left flank pain began last night, resolved, returned this am. No medications prior to arrival. He has hx kidney stone in the past.   The history is provided by the patient. No language interpreter was used.  Flank Pain Pertinent negatives include no chest pain, no abdominal pain, no headaches and no shortness of breath.      Past Medical History:  Diagnosis Date   Carpal tunnel syndrome    Chest pain    Degeneration of cervical intervertebral disc    Diabetes (HCC)    Hyperlipidemia    Obstructive sleep apnea (adult) (pediatric)    PVC's (premature ventricular contractions)    Shortness of breath     Patient Active Problem List   Diagnosis Date Noted   Diabetes (HCC)    Chest pain 08/04/2013   Hyperlipidemia 08/04/2013   Asthmatic bronchitis , chronic (HCC) 09/12/2010   OBSTRUCTIVE SLEEP APNEA 07/27/2009   CARPAL TUNNEL SYNDROME, BILATERAL 07/27/2009   DEGENERATIVE DISC DISEASE, CERVICAL SPINE 07/27/2009    Past Surgical History:  Procedure Laterality Date   BACK SURGERY  10/2009   HERNIA REPAIR  03/2006   NECK SURGERY  11/2006   NM MYOCAR PERF WALL MOTION  11/14/2010   normal       Family History  Problem  Relation Age of Onset   Pancreatic cancer Mother    Heart disease Father    Breast cancer Sister    Lung cancer Maternal Grandmother    Heart disease Maternal Grandfather     Social History   Tobacco Use   Smoking status: Former    Packs/day: 2.00    Years: 30.00    Pack years: 60.00    Types: Cigarettes    Quit date: 04/25/2010    Years since quitting: 10.6   Smokeless tobacco: Never  Vaping Use   Vaping Use: Never used  Substance Use Topics   Alcohol use: Yes    Comment: 3-4 drinks per week   Drug use: No    Home Medications Prior to Admission medications   Medication Sig Start Date End Date Taking? Authorizing Provider  HYDROcodone-acetaminophen (NORCO/VICODIN) 5-325 MG tablet Take 1 tablet by mouth every 4 (four) hours as needed. 12/23/20  Yes Tanda Rockers A, DO  tamsulosin (FLOMAX) 0.4 MG CAPS capsule Take 1 capsule (0.4 mg total) by mouth daily for 10 days. 12/23/20 01/02/21 Yes Tanda Rockers A, DO  Albuterol Sulfate (PROAIR RESPICLICK) 108 (90 BASE) MCG/ACT AEPB Inhale 2 puffs into the lungs every 6 (six) hours as needed. 01/11/14   ClanceMaree Krabbe, MD  atorvastatin (LIPITOR) 20 MG tablet Take 20 mg by mouth daily.  11/05/18   [provider]  meloxicam (MOBIC) 15 MG tablet Take by mouth. 10/28/17  [provider]  metFORMIN (GLUCOPHAGE-XR) 500 MG 24 hr tablet TAKE TWO TABLETS BY MOUTH TWICE DAILY 12/12/16   [provider]  pantoprazole (PROTONIX) 40 MG tablet Take 1 tablet (40 mg total) by mouth daily before breakfast. 12/15/18   Icard, Elige Radon L, DO  Pseudoeph-Doxylamine-DM-APAP (NYQUIL PO) Take by mouth at bedtime.    [provider]  pseudoephedrine (SUDAFED) 30 MG tablet Take 30 mg by mouth at bedtime as needed for congestion.    [provider]  SYMBICORT 160-4.5 MCG/ACT inhaler INHALE 2 PUFFS INTO THE LUNGS 2 (TWO) TIMES DAILY. Patient taking differently: 1 puff at bedtime 09/05/13   Clance, Maree Krabbe, MD    Allergies     Celexa [citalopram] and Hydrocodone  Review of Systems   Review of Systems  Constitutional:  Negative for chills and fever.  HENT:  Negative for facial swelling and trouble swallowing.   Eyes:  Negative for photophobia and visual disturbance.  Respiratory:  Negative for cough and shortness of breath.   Cardiovascular:  Negative for chest pain and palpitations.  Gastrointestinal:  Positive for nausea. Negative for abdominal pain and vomiting.  Endocrine: Negative for polydipsia and polyuria.  Genitourinary:  Positive for difficulty urinating and flank pain. Negative for hematuria.  Musculoskeletal:  Negative for gait problem and joint swelling.  Skin:  Negative for pallor and rash.  Neurological:  Negative for syncope and headaches.  Psychiatric/Behavioral:  Negative for agitation and confusion.    Physical Exam Updated Vital Signs BP 133/88   Pulse 81   Temp 98 F (36.7 C) (Oral)   Resp 20   SpO2 97%   Physical Exam Vitals and nursing note reviewed.  Constitutional:      General: He is not in acute distress.    Appearance: He is well-developed.  HENT:     Head: Normocephalic and atraumatic.     Right Ear: External ear normal.     Left Ear: External ear normal.     Mouth/Throat:     Mouth: Mucous membranes are moist.  Eyes:     General: No scleral icterus. Cardiovascular:     Rate and Rhythm: Normal rate and regular rhythm.     Pulses: Normal pulses.     Heart sounds: Normal heart sounds.  Pulmonary:     Effort: Pulmonary effort is normal. No respiratory distress.     Breath sounds: Normal breath sounds.  Abdominal:     General: Abdomen is flat.     Palpations: Abdomen is soft.     Tenderness: There is abdominal tenderness. There is left CVA tenderness.     Comments: Not peritoneal  Musculoskeletal:        General: Normal range of motion.     Cervical back: Normal range of motion.     Right lower leg: No edema.     Left lower leg: No edema.  Skin:    General:  Skin is warm and dry.     Capillary Refill: Capillary refill takes less than 2 seconds.  Neurological:     Mental Status: He is alert and oriented to person, place, and time.  Psychiatric:        Mood and Affect: Mood normal.        Behavior: Behavior normal.    ED Results / Procedures / Treatments   Labs (all labs ordered are listed, but only abnormal results are displayed) Labs Reviewed  URINALYSIS, ROUTINE W REFLEX MICROSCOPIC - Abnormal; Notable for the following components:  Result Value   Ketones, ur 40 (*)    All other components within normal limits  COMPREHENSIVE METABOLIC PANEL - Abnormal; Notable for the following components:   Glucose, Bld 113 (*)    Creatinine, Ser 1.83 (*)    GFR, Estimated 41 (*)    All other components within normal limits  CBC WITH DIFFERENTIAL/PLATELET  LIPASE, BLOOD    EKG None  Radiology CT Renal Stone Study  Result Date: 12/23/2020 CLINICAL DATA:  Flank pain EXAM: CT ABDOMEN AND PELVIS WITHOUT CONTRAST TECHNIQUE: Multidetector CT imaging of the abdomen and pelvis was performed following the standard protocol without IV contrast. COMPARISON:  None. FINDINGS: Lower chest: Lung bases are clear. Hepatobiliary: No focal hepatic lesion. No biliary duct dilatation. Common bile duct is normal. Pancreas: Pancreas is normal. No ductal dilatation. No pancreatic inflammation. Spleen: Normal spleen Adrenals/urinary tract: Adrenal glands are normal. Simple fluid attenuation cysts of the kidneys. Benign periphery calcified lesion in the cortex of the RIGHT. There is mild ureteral dilatation on the LEFT. There is a very small partially obstructing calculus in the distal LEFT ureter measuring 1 mm (image 73/series 2). This distal LEFT ureteral calculus is approximately 1 cm from the LEFT vesicoureteral junction. No bladder calculi No RIGHT nephrolithiasis or ureterolithiasis. Stomach/Bowel: Stomach, small bowel, appendix, and cecum are normal. The colon and  rectosigmoid colon are normal. Vascular/Lymphatic: Abdominal aorta is normal caliber with atherosclerotic calcification. There is no retroperitoneal or periportal lymphadenopathy. No pelvic lymphadenopathy. Reproductive: Prostate normal Other: No free fluid. Musculoskeletal: No aggressive osseous lesion. IMPRESSION: 1. Small nonobstructing calculus in the distal LEFT ureter just outside the vesicoureteral junction. 2. Bilateral benign-appearing renal cysts and calcifications. 3.  Aortic Atherosclerosis (ICD10-I70.0). Electronically Signed   By: Genevive Bi M.D.   On: 12/23/2020 10:33    Procedures Procedures   Medications Ordered in ED Medications  sodium chloride 0.9 % bolus 500 mL (has no administration in time range)  sodium chloride 0.9 % bolus 1,000 mL (1,000 mLs Intravenous New Bag/Given 12/23/20 0841)  ketorolac (TORADOL) 15 MG/ML injection 15 mg (15 mg Intravenous Given 12/23/20 0842)  ondansetron (ZOFRAN) injection 4 mg (4 mg Intravenous Given 12/23/20 0844)  morphine 4 MG/ML injection 4 mg (4 mg Intravenous Given 12/23/20 0851)    ED Course  I have reviewed the triage vital signs and the nursing notes.  Pertinent labs & imaging results that were available during my care of the patient were reviewed by me and considered in my medical decision making (see chart for details).    MDM Rules/Calculators/A&P                           CC: flank pain  This patient complains of flank pain; this involves an extensive number of treatment options and is a complaint that carries with it a high risk of complications and morbidity. Vital signs were reviewed. Serious etiologies considered.  Record review:  Previous records obtained and reviewed   Work up as above, notable for:  Labs & imaging results that were available during my care of the patient were reviewed by me and considered in my medical decision making.   I ordered imaging studies which included CT renal and I independently  visualized and interpreted imaging which showed nephrolithiasis   UA without infection, no leukocytosis, not septic  Management: Pt with partially obstructing calculus in the distal left ureter, around 88mm in size per radiology.  Patient has mild AKI.  He is urinating appropriately.  He is tolerant oral intake with difficulty.  He has received IV fluids in the emergency department.  Reports his pain has improved significantly.  Is reasonable to trial outpatient therapy at this time given the size of the stone and the location.  Start patient on Flomax and oral analgesics.-Follow-up with urology as an outpatient.  Also follow-up with PCP in the next few days to repeat metabolic panel.  The patient improved significantly and was discharged in stable condition. Detailed discussions were had with the patient regarding current findings, and need for close f/u with PCP or on call doctor. The patient has been instructed to return immediately if the symptoms worsen in any way for re-evaluation. Patient verbalized understanding and is in agreement with current care plan. All questions answered prior to discharge.          This chart was dictated using voice recognition software.  Despite best efforts to proofread,  errors can occur which can change the documentation meaning.  Final Clinical Impression(s) / ED Diagnoses Final diagnoses:  Nephrolithiasis  AKI (acute kidney injury) (HCC)  Mild dehydration    Rx / DC Orders ED Discharge Orders          Ordered    tamsulosin (FLOMAX) 0.4 MG CAPS capsule  Daily        12/23/20 1043    HYDROcodone-acetaminophen (NORCO/VICODIN) 5-325 MG tablet  Every 4 hours PRN        12/23/20 1043             Sloan Leiter, DO 12/23/20 1722

## 2020-12-23 NOTE — ED Notes (Signed)
Patient transported to CT 

## 2020-12-23 NOTE — ED Triage Notes (Signed)
Patient reports to the ER for left sided flank pain. Patient has a hx of kidney stones but it has been many years since he had one. Patient reports yesterday morning he started having intermittent flank pain but this morning around 4am it became constant and it woke him from his sleep. Patient pacing the room

## 2020-12-25 DIAGNOSIS — N200 Calculus of kidney: Secondary | ICD-10-CM | POA: Diagnosis not present

## 2020-12-25 DIAGNOSIS — N289 Disorder of kidney and ureter, unspecified: Secondary | ICD-10-CM | POA: Diagnosis not present

## 2020-12-25 NOTE — Progress Notes (Signed)
Cardiology Office Note:    Date:  12/26/2020   ID:  Scott Mccarty, DOB [birth year], MRN [id number]  PCP:  Bernita [facility] Cardiologist: Nanetta Batty, MD   Reason for visit: Shortness of breath  History of Present Illness:    Scott Mccarty is a 60 y.o. male with a hx of, OSA noncompliant with CPAP, obesity, hyperlipidemia, family history of heart disease (father with stent in his 65s), remote tobacco use.  He last saw Dr. Gery Pray in December 2019.  Echo in 2019 showed EF of 50 to 55% and mild LVH.  Myoview stress test with no ischemia.  He saw his PCP on October 22, 2020 for shortness of breath.  CT lung screening in May 2022 showed aortic atherosclerosis, two-vessel coronary disease and calcifications of the aortic valve.  Patient was referred to cardiology.  Patient follows up with pulmonology for his asthma.  He visited [facility] emergency department on December 23, 2020 with left-sided flank pain.  He was treated for nephrolithiasis and discharged home.  Today, the patient said he had difficulties after having COVID in January.  He had shortness of breath even with climbing stairs and right leg swelling.  This has greatly improved.  He mentions that he has occasional left-sided chest pain that feels sharp and may last for 15 to 20 seconds.  He previously had it 1-2 times per week but now has it 1-2 times per month.  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  He has no associated symptoms and not exertional.  He notices his heart rate can get up to 114 when he is laying down at night.  He cannot pinpoint this as palpitations but may feel some irregularity in his breathing.  He does feel like he breathes better with his head elevated at night.  Denies PND.  He denies syncope.  He has been unable to tolerate CPAP.  He said he has tried 3 different machines and different masks.  He is looking  into this [facility] implant to help with his sleep apnea.  He was noted to have AKI on his last lab results.  Creatinine was 1.8 on October 30 up from 0.99 in August.  He had repeat lab work done by his PCP a couple days ago and is waiting for the results.  He has started [employer] x 3 weeks.  On his diet, he eats bars and shakes and 1 meal per day.  As time progresses, you start weaning off their supplements and introduce more meals.  He has 7-8 friends that have done this diet over the past year and have done well.  He has seen weight loss of 11 pounds in the last 3 weeks.  His previous weight was up to 220.  Now he is down to 204.  His goal weight is 180 pounds.    Past Medical History:  Diagnosis Date   Carpal tunnel syndrome    Chest pain    Degeneration of cervical intervertebral disc    Diabetes (HCC)    Hyperlipidemia    Obstructive sleep apnea (adult) (pediatric)    PVC's (premature ventricular contractions)    Shortness of breath     Past Surgical History:  Procedure Laterality Date   BACK SURGERY  10/2009   HERNIA REPAIR  03/2006   NECK SURGERY  11/2006   NM MYOCAR PERF WALL MOTION  11/14/2010  normal    Current Medications: Current Meds  Medication Sig   Albuterol Sulfate (PROAIR RESPICLICK) 108 (90 BASE) MCG/ACT AEPB Inhale 2 puffs into the lungs every 6 (six) hours as needed.   atorvastatin (LIPITOR) 20 MG tablet Take 20 mg by mouth daily.    metFORMIN (GLUCOPHAGE-XR) 500 MG 24 hr tablet TAKE TWO TABLETS BY MOUTH TWICE DAILY   pantoprazole (PROTONIX) 40 MG tablet Take 1 tablet (40 mg total) by mouth daily before breakfast.   SYMBICORT 160-4.5 MCG/ACT inhaler INHALE 2 PUFFS INTO THE LUNGS 2 (TWO) TIMES DAILY.   [DISCONTINUED] HYDROcodone-acetaminophen (NORCO/VICODIN) 5-325 MG tablet Take 1 tablet by mouth every 4 (four) hours as needed.   [DISCONTINUED] pseudoephedrine (SUDAFED) 30 MG tablet Take 30 mg by mouth at bedtime as needed for congestion.   [DISCONTINUED]  tamsulosin (FLOMAX) 0.4 MG CAPS capsule Take 1 capsule (0.4 mg total) by mouth daily for 10 days.     Allergies:   Celexa [citalopram] and Hydrocodone   Social History   Socioeconomic History   Marital status: Married    Spouse name: Not on file   Number of children: Not on file   Years of education: Not on file   Highest education level: Not on file  Occupational History   Occupation: Occupational hygienist  Tobacco Use   Smoking status: Former    Packs/day: 2.00    Years: 30.00    Pack years: 60.00    Types: Cigarettes    Quit date: 04/25/2010    Years since quitting: 10.6   Smokeless tobacco: Never  Vaping Use   Vaping Use: Never used  Substance and Sexual Activity   Alcohol use: Yes    Comment: 3-4 drinks per week   Drug use: No   Sexual activity: Yes  Other Topics Concern   Not on file  Social History Narrative   Not on file   Social Determinants of Health   Financial Resource Strain: Not on file  Food Insecurity: Not on file  Transportation Needs: Not on file  Physical Activity: Not on file  Stress: Not on file  Social Connections: Not on file     Family History: The patient's family history includes Breast cancer in his sister; Heart disease in his father and maternal grandfather; Lung cancer in his maternal grandmother; Pancreatic cancer in his mother.  ROS:   Please see the history of present illness.     EKGs/Labs/Other Studies Reviewed:    EKG:  The ekg ordered today demonstrates normal sinus rhythm, heart rate 82, PR interval 146 ms, QRS duration 94 ms.  Recent Labs: 12/23/2020: ALT 39; BUN 21; Creatinine, Ser 1.83; Hemoglobin 15.3; Platelets 173; Potassium 4.3; Sodium 139   Recent Lipid Panel No results found for: CHOL, TRIG, HDL, LDLCALC, LDLDIRECT  Physical Exam:    VS:  BP 114/68   Pulse 82   Ht 5\' 9"  (1.753 m)   Wt 204 lb 8 oz (92.8 kg)   SpO2 96%   BMI 30.20 kg/m    No data found.  Wt Readings from Last 3 Encounters:  12/26/20 204 lb 8  oz (92.8 kg)  07/20/19 214 lb (97.1 kg)  06/09/19 214 lb 12.8 oz (97.4 kg)     GEN:  Well nourished, well developed in no acute distress HEENT: Normal NECK: No JVD; No carotid bruits CARDIAC: RRR, no murmurs, rubs, gallops RESPIRATORY:  Clear to auscultation without rales, wheezing or rhonchi  ABDOMEN: Soft, non-tender, non-distended MUSCULOSKELETAL: No edema; No deformity  SKIN: Warm and dry NEUROLOGIC:  Alert and oriented PSYCHIATRIC:  Normal affect     ASSESSMENT AND PLAN   Coronary artery disease/Aortic atherosclerosis -CT lung cancer screening in 2022 with aortic atherosclerosis and two-vessel CAD -Myoview in 2019 with no ischemia or evidence of infarction. -Continue statin.  Check fasting lipids. -Continue weight loss efforts. -BP controlled. -Can check hemoglobin A1c at follow-up in 8 weeks (this will give 3 months on his new diet).  Precordial pain -With recent AKI, will defer on doing CTA of the coronaries.  With recent chest pain, will evaluate with treadmill nuclear stress.  Palpitations -Patient notices elevated heart rate at rest approximately every 2 days. -Check 2-week Zio patch to rule out atrial fibrillation.  Patient is at higher risk for A. fib given his sleep apnea, diabetes and obesity.  Aortic valve calcification on CT -Echo in 2019 with EF 50 to 55%, mild LVH, no significant valve disease -Recheck echo  Hyperlipidemia -Goal LDL less than 70 with diabetes and evidence of CAD on CT. -Continue statin.  Check fasting lipids. -Discussed cholesterol lowering diets - Mediterranean diet, DASH diet, vegetarian diet, low-carbohydrate diet and avoidance of trans fats.  Discussed healthier choice substitutes.  Nuts, high-fiber foods, and fiber supplements may also improve lipids.    Obesity -Can continue Optavia diet plan.  Recommend staying hydrated with recent AKI. -Discussed how even a 5-10% weight loss can have cardiovascular benefits.   -Recommend  moderate intensity activity for 30 minutes 5 days/week and the DASH diet.   Disposition - Follow-up in 8 weeks with me to review testing and risk factor control.   Shared Decision Making/Informed Consent The risks [chest pain, shortness of breath, cardiac arrhythmias, dizziness, blood pressure fluctuations, myocardial infarction, stroke/transient ischemic attack, nausea, vomiting, allergic reaction, radiation exposure, metallic taste sensation and life-threatening complications (estimated to be 1 in 10,000)], benefits (risk stratification, diagnosing coronary artery disease, treatment guidance) and alternatives of a nuclear stress test were discussed in detail with Scott Mccarty and he agrees to proceed.    Medication Adjustments/Labs and Tests Ordered: Current medicines are reviewed at length with the patient today.  Concerns regarding medicines are outlined above.  Orders Placed This Encounter  Procedures   Lipid panel   MYOCARDIAL PERFUSION IMAGING   LONG TERM MONITOR (3-14 DAYS)   EKG 12-Lead   ECHOCARDIOGRAM COMPLETE    No orders of the defined types were placed in this encounter.   Patient Instructions  Medication Instructions:   No  changes *If you need a refill on your cardiac medications before your next appointment, please call your pharmacy*   Lab Work: Lipid today If you have labs (blood work) drawn today and your tests are completely normal, you will receive your results only by: MyChart Message (if you have MyChart) OR A paper copy in the mail If you have any lab test that is abnormal or we need to change your treatment, we will call you to review the results.   Testing/Procedures:   Will be schedule at [address]  Your physician has requested that you have an echocardiogram. Echocardiography is a painless test that uses sound waves to create images of your heart. It provides your doctor with information about the size and shape of your  heart and how well your heart's chambers and valves are working. This procedure takes approximately one hour. There are no restrictions for this procedure.  And Will be mailed to your home in 5 to  10 days Your physician has recommended that you wear a holter monitor 14 days ZIO. Holter monitors are medical devices that record the heart's electrical activity. Doctors most often use these monitors to diagnose arrhythmias. Arrhythmias are problems with the speed or rhythm of the heartbeat. The monitor is a small, portable device. You can wear one while you do your normal daily activities. This is usually used to diagnose what is causing palpitations/syncope (passing out).  And  Will be at schedule at [address] Your physician has requested that you have en exercise stress myoview. Please follow instruction sheet, as given.   Follow-Up: At [facility], you and your health needs are our priority.  As part of our continuing mission to provide you with exceptional heart care, we have created designated Provider Care Teams.  These Care Teams include your primary Cardiologist (physician) and Advanced Practice Providers (APPs -  Physician Assistants and Nurse Practitioners) who all work together to provide you with the care you need, when you need it.     Your next appointment:   2 month(s)  The format for your next appointment:   In Person  Provider:   Bryan Lemma, MD  Other instructions  Christena Deem- Long Term Monitor Instructions  Your physician has requested you wear a ZIO patch monitor for 14 days.  This is a single patch monitor. [employer] supplies one patch monitor per enrollment. Additional stickers are not available. Please do not apply patch if you will be having a Nuclear Stress Test,  Echocardiogram, Cardiac CT, MRI, or Chest Xray during the period you would be wearing the  monitor. The patch cannot be worn during these tests. You cannot remove and re-apply the  ZIO  XT patch monitor.  Your ZIO patch monitor will be mailed 3 day USPS to your address on file. It may take 3-5 days  to receive your monitor after you have been enrolled.  Once you have received your monitor, please review the enclosed instructions. Your monitor  has already been registered assigning a specific monitor serial # to you.  Billing and Patient Assistance Program Information  We have supplied [employer] with any of your insurance information on file for billing purposes. [employer] offers a sliding scale Patient Assistance Program for patients that do not have  insurance, or whose insurance does not completely cover the cost of the ZIO monitor.  You must apply for the Patient Assistance Program to qualify for this discounted rate.  To apply, please call [employer] at [phone], select option 4, select option 2, ask to apply for  Patient Assistance Program. Meredeth Ide will ask your household income, and how many people  are in your household. They will quote your out-of-pocket cost based on that information.  [employer] will also be able to set up a 48-month, interest-free payment plan if needed.  Applying the monitor   Shave hair from upper left chest.  Hold abrader disc by orange tab. Rub abrader in 40 strokes over the upper left chest as  indicated in your monitor instructions.  Clean area with 4 enclosed alcohol pads. Let dry.  Apply patch as indicated in monitor instructions. Patch will be placed under collarbone on left  side of chest with arrow pointing upward.  Rub patch adhesive wings for 2 minutes. Remove white label marked "1". Remove the white  label marked "2". Rub patch adhesive wings for 2 additional minutes.  While looking in a mirror, press and release button in center  of patch. A small green light will  flash 3-4 times. This will be your only indicator that the monitor has been turned on.  Do not shower for the first 24 hours. You may shower after the first 24 hours.   Press the button if you feel a symptom. You will hear a small click. Record Date, Time and  Symptom in the Patient Logbook.  When you are ready to remove the patch, follow instructions on the last 2 pages of Patient  Logbook. Stick patch monitor onto the last page of Patient Logbook.  Place Patient Logbook in the blue and white box. Use locking tab on box and tape box closed  securely. The blue and white box has prepaid postage on it. Please place it in the mailbox as  soon as possible. Your physician should have your test results approximately 7 days after the  monitor has been mailed back to [facility].  Call [facility] Customer Care at [phone] if you have questions regarding  your ZIO XT patch monitor. Call them immediately if you see an orange light blinking on your  monitor.  If your monitor falls off in less than 4 days, contact our Monitor department at [phone].  If your monitor becomes loose or falls off after 4 days call [employer] at [phone] for  suggestions on securing your monitor    Signed, Bernette Mayers  12/26/2020 9:06 AM    [facility]

## 2020-12-26 ENCOUNTER — Ambulatory Visit: Payer: BC Managed Care – PPO | Admitting: Physician Assistant

## 2020-12-26 ENCOUNTER — Other Ambulatory Visit: Payer: Self-pay

## 2020-12-26 ENCOUNTER — Ambulatory Visit (INDEPENDENT_AMBULATORY_CARE_PROVIDER_SITE_OTHER): Payer: BC Managed Care – PPO

## 2020-12-26 VITALS — BP 114/68 | HR 82 | Ht 69.0 in | Wt 204.5 lb

## 2020-12-26 DIAGNOSIS — R0602 Shortness of breath: Secondary | ICD-10-CM

## 2020-12-26 DIAGNOSIS — I2510 Atherosclerotic heart disease of native coronary artery without angina pectoris: Secondary | ICD-10-CM

## 2020-12-26 DIAGNOSIS — R072 Precordial pain: Secondary | ICD-10-CM

## 2020-12-26 DIAGNOSIS — E782 Mixed hyperlipidemia: Secondary | ICD-10-CM | POA: Diagnosis not present

## 2020-12-26 DIAGNOSIS — R002 Palpitations: Secondary | ICD-10-CM

## 2020-12-26 LAB — LIPID PANEL
Chol/HDL Ratio: 3.3 ratio (ref 0.0–5.0)
Cholesterol, Total: 130 mg/dL (ref 100–199)
HDL: 40 mg/dL (ref >39)
LDL Chol Calc (NIH): 73 mg/dL (ref 0–99)
Triglycerides: 86 mg/dL (ref 0–149)
VLDL Cholesterol Cal: 17 mg/dL (ref 5–40)

## 2020-12-26 NOTE — Patient Instructions (Addendum)
Medication Instructions:   No  changes *If you need a refill on your cardiac medications before your next appointment, please call your pharmacy*   Lab Work: Lipid today If you have labs (blood work) drawn today and your tests are completely normal, you will receive your results only by: MyChart Message (if you have MyChart) OR A paper copy in the mail If you have any lab test that is abnormal or we need to change your treatment, we will call you to review the results.   Testing/Procedures:   Will be schedule at [address]  Your physician has requested that you have an echocardiogram. Echocardiography is a painless test that uses sound waves to create images of your heart. It provides your doctor with information about the size and shape of your heart and how well your heart's chambers and valves are working. This procedure takes approximately one hour. There are no restrictions for this procedure.  And Will be mailed to your home in 5 to 10 days Your physician has recommended that you wear a holter monitor 14 days ZIO. Holter monitors are medical devices that record the heart's electrical activity. Doctors most often use these monitors to diagnose arrhythmias. Arrhythmias are problems with the speed or rhythm of the heartbeat. The monitor is a small, portable device. You can wear one while you do your normal daily activities. This is usually used to diagnose what is causing palpitations/syncope (passing out).  And  Will be at schedule at [address] Your physician has requested that you have en exercise stress myoview. Please follow instruction sheet, as given.   Follow-Up: At [facility], you and your health needs are our priority.  As part of our continuing mission to provide you with exceptional heart care, we have created designated Provider Care Teams.  These Care Teams include your primary Cardiologist (physician) and Advanced Practice  Providers (APPs -  Physician Assistants and Nurse Practitioners) who all work together to provide you with the care you need, when you need it.     Your next appointment:   2 month(s)  The format for your next appointment:   In Person  Provider:    Juanda Crumble, PA-C     Other instructions  Scott Mccarty- Long Term Monitor Instructions  Your physician has requested you wear a ZIO patch monitor for 14 days.  This is a single patch monitor. [employer] supplies one patch monitor per enrollment. Additional stickers are not available. Please do not apply patch if you will be having a Nuclear Stress Test,  Echocardiogram, Cardiac CT, MRI, or Chest Xray during the period you would be wearing the  monitor. The patch cannot be worn during these tests. You cannot remove and re-apply the  ZIO XT patch monitor.  Your ZIO patch monitor will be mailed 3 day USPS to your address on file. It may take 3-5 days  to receive your monitor after you have been enrolled.  Once you have received your monitor, please review the enclosed instructions. Your monitor  has already been registered assigning a specific monitor serial # to you.  Billing and Patient Assistance Program Information  We have supplied [employer] with any of your insurance information on file for billing purposes. [employer] offers a sliding scale Patient Assistance Program for patients that do not have  insurance, or whose insurance does not completely cover the cost of the ZIO monitor.  You must apply for the Patient Assistance  Program to qualify for this discounted rate.  To apply, please call [employer] at [phone], select option 4, select option 2, ask to apply for  Patient Assistance Program. Scott Mccarty will ask your household income, and how many people  are in your household. They will quote your out-of-pocket cost based on that information.  [employer] will also be able to set up a 68-month, interest-free payment plan if  needed.  Applying the monitor   Shave hair from upper left chest.  Hold abrader disc by orange tab. Rub abrader in 40 strokes over the upper left chest as  indicated in your monitor instructions.  Clean area with 4 enclosed alcohol pads. Let dry.  Apply patch as indicated in monitor instructions. Patch will be placed under collarbone on left  side of chest with arrow pointing upward.  Rub patch adhesive wings for 2 minutes. Remove white label marked "1". Remove the white  label marked "2". Rub patch adhesive wings for 2 additional minutes.  While looking in a mirror, press and release button in center of patch. A small green light will  flash 3-4 times. This will be your only indicator that the monitor has been turned on.  Do not shower for the first 24 hours. You may shower after the first 24 hours.  Press the button if you feel a symptom. You will hear a small click. Record Date, Time and  Symptom in the Patient Logbook.  When you are ready to remove the patch, follow instructions on the last 2 pages of Patient  Logbook. Stick patch monitor onto the last page of Patient Logbook.  Place Patient Logbook in the blue and white box. Use locking tab on box and tape box closed  securely. The blue and white box has prepaid postage on it. Please place it in the mailbox as  soon as possible. Your physician should have your test results approximately 7 days after the  monitor has been mailed back to [facility].  Call [facility] Customer Care at [phone] if you have questions regarding  your ZIO XT patch monitor. Call them immediately if you see an orange light blinking on your  monitor.  If your monitor falls off in less than 4 days, contact our Monitor department at [phone].  If your monitor becomes loose or falls off after 4 days call [employer] at [phone] for  suggestions on securing your monitor

## 2020-12-26 NOTE — Progress Notes (Unsigned)
Patient enrolled for [employer] to mail a 14 day ZIO XT monitor to his address on file.  Dr. Allyson Sabal to read.

## 2020-12-27 DIAGNOSIS — M25561 Pain in right knee: Secondary | ICD-10-CM | POA: Diagnosis not present

## 2020-12-27 DIAGNOSIS — G8929 Other chronic pain: Secondary | ICD-10-CM | POA: Diagnosis not present

## 2020-12-27 DIAGNOSIS — M6281 Muscle weakness (generalized): Secondary | ICD-10-CM | POA: Diagnosis not present

## 2020-12-30 DIAGNOSIS — R0602 Shortness of breath: Secondary | ICD-10-CM

## 2020-12-30 DIAGNOSIS — R002 Palpitations: Secondary | ICD-10-CM

## 2020-12-30 DIAGNOSIS — R072 Precordial pain: Secondary | ICD-10-CM | POA: Diagnosis not present

## 2020-12-30 DIAGNOSIS — I2510 Atherosclerotic heart disease of native coronary artery without angina pectoris: Secondary | ICD-10-CM

## 2020-12-31 DIAGNOSIS — H2513 Age-related nuclear cataract, bilateral: Secondary | ICD-10-CM | POA: Diagnosis not present

## 2020-12-31 DIAGNOSIS — Z7984 Long term (current) use of oral hypoglycemic drugs: Secondary | ICD-10-CM | POA: Diagnosis not present

## 2020-12-31 DIAGNOSIS — E119 Type 2 diabetes mellitus without complications: Secondary | ICD-10-CM | POA: Diagnosis not present

## 2021-01-01 DIAGNOSIS — G8929 Other chronic pain: Secondary | ICD-10-CM | POA: Diagnosis not present

## 2021-01-01 DIAGNOSIS — M25561 Pain in right knee: Secondary | ICD-10-CM | POA: Diagnosis not present

## 2021-01-01 DIAGNOSIS — M6281 Muscle weakness (generalized): Secondary | ICD-10-CM | POA: Diagnosis not present

## 2021-01-03 DIAGNOSIS — M6281 Muscle weakness (generalized): Secondary | ICD-10-CM | POA: Diagnosis not present

## 2021-01-03 DIAGNOSIS — M25561 Pain in right knee: Secondary | ICD-10-CM | POA: Diagnosis not present

## 2021-01-03 DIAGNOSIS — G8929 Other chronic pain: Secondary | ICD-10-CM | POA: Diagnosis not present

## 2021-01-15 ENCOUNTER — Telehealth (HOSPITAL_COMMUNITY): Payer: Self-pay | Admitting: *Deleted

## 2021-01-15 ENCOUNTER — Encounter (HOSPITAL_COMMUNITY): Payer: Self-pay | Admitting: *Deleted

## 2021-01-15 NOTE — Telephone Encounter (Signed)
Patient given detailed instructions per Myocardial Perfusion Study Information Sheet for the test on 01/23/21 at 1045. Patient notified to arrive 15 minutes early and that it is imperative to arrive on time for appointment to keep from having the test rescheduled.  If you need to cancel or reschedule your appointment, please call the office within 24 hours of your appointment. . Patient verbalized understanding.Destry Bezdek, Adelene Idler Mychart letter sent.

## 2021-01-21 DIAGNOSIS — R002 Palpitations: Secondary | ICD-10-CM | POA: Diagnosis not present

## 2021-01-21 DIAGNOSIS — R0602 Shortness of breath: Secondary | ICD-10-CM | POA: Diagnosis not present

## 2021-01-21 DIAGNOSIS — I2510 Atherosclerotic heart disease of native coronary artery without angina pectoris: Secondary | ICD-10-CM | POA: Diagnosis not present

## 2021-01-21 DIAGNOSIS — R072 Precordial pain: Secondary | ICD-10-CM | POA: Diagnosis not present

## 2021-01-22 ENCOUNTER — Encounter (HOSPITAL_COMMUNITY): Payer: BC Managed Care – PPO

## 2021-01-23 ENCOUNTER — Ambulatory Visit (HOSPITAL_BASED_OUTPATIENT_CLINIC_OR_DEPARTMENT_OTHER): Payer: BC Managed Care – PPO

## 2021-01-23 ENCOUNTER — Ambulatory Visit (HOSPITAL_COMMUNITY): Payer: BC Managed Care – PPO | Attending: Cardiology

## 2021-01-23 ENCOUNTER — Other Ambulatory Visit: Payer: Self-pay

## 2021-01-23 DIAGNOSIS — R002 Palpitations: Secondary | ICD-10-CM | POA: Insufficient documentation

## 2021-01-23 DIAGNOSIS — R072 Precordial pain: Secondary | ICD-10-CM

## 2021-01-23 DIAGNOSIS — I2510 Atherosclerotic heart disease of native coronary artery without angina pectoris: Secondary | ICD-10-CM | POA: Diagnosis not present

## 2021-01-23 DIAGNOSIS — R0602 Shortness of breath: Secondary | ICD-10-CM | POA: Insufficient documentation

## 2021-01-23 LAB — MYOCARDIAL PERFUSION IMAGING
Estimated workload: 7
Exercise duration (min): 6 min
LV dias vol: 59 mL (ref 62–150)
LV sys vol: 24 mL
MPHR: 158 {beats}/min
Nuc Stress EF: 59 %
Peak HR: 141 {beats}/min
Percent HR: 89 %
Rest HR: 80 {beats}/min
Rest Nuclear Isotope Dose: 8.8 mCi
SDS: 3
SRS: 0
SSS: 3
ST Depression (mm): 0 mm
Stress Nuclear Isotope Dose: 31.2 mCi
TID: 0.9

## 2021-01-23 LAB — ECHOCARDIOGRAM COMPLETE
Area-P 1/2: 3.19 cm2
Height: 69 in
S' Lateral: 2.6 cm
Weight: 3264 oz

## 2021-01-23 MED ORDER — TECHNETIUM TC 99M TETROFOSMIN IV KIT
8.8000 | PACK | Freq: Once | INTRAVENOUS | Status: AC | PRN
  Administered 2021-01-23: 8.8 via INTRAVENOUS
  Filled 2021-01-23: qty 9

## 2021-01-23 MED ORDER — TECHNETIUM TC 99M TETROFOSMIN IV KIT
31.2000 | PACK | Freq: Once | INTRAVENOUS | Status: AC | PRN
  Administered 2021-01-23: 31.2 via INTRAVENOUS
  Filled 2021-01-23: qty 32

## 2021-01-25 ENCOUNTER — Telehealth: Payer: Self-pay

## 2021-01-25 NOTE — Telephone Encounter (Addendum)
Spoke with patient regarding results of stress test.----- Message from Cannon Kettle, PA-C sent at 01/25/2021  1:55 PM EST ----- Randie Heinz news.  Your stress test was normal.  That is great that you were able to reach your target heart rate.  No evidence of new blockages were heart muscle damage.  No EKG changes.  This is very reassuring.

## 2021-01-25 NOTE — Telephone Encounter (Addendum)
Left message for patient regrding results of echocardiogram.----- Message from Cannon Kettle, PA-C sent at 01/25/2021  1:52 PM EST ----- Good news.  Your heart squeeze is normal at 60 to 65% (improved from 50-55% in 2019) and there is no evidence of aortic valve stenosis/other valve disease.

## 2021-02-05 ENCOUNTER — Ambulatory Visit: Payer: BC Managed Care – PPO | Admitting: Cardiovascular Disease

## 2021-02-05 DIAGNOSIS — E782 Mixed hyperlipidemia: Secondary | ICD-10-CM | POA: Diagnosis not present

## 2021-02-05 DIAGNOSIS — E119 Type 2 diabetes mellitus without complications: Secondary | ICD-10-CM | POA: Diagnosis not present

## 2021-02-26 NOTE — Progress Notes (Signed)
Cardiology Office Note:    Date:  02/27/2021   ID:  Scott Gerlachavid Mcclenton, DOB 10/13/1960, MRN [id number]  PCP:  Bernita BuffyWilliams, Breejante J, PA-C [facility] Cardiologist: Nanetta BattyJonathan Berry, MD   Reason for visit: 5065-month follow-up  History of Present Illness:    Scott GerlachDavid Mccarty is a 61 y.o. male with a hx of OSA noncompliant with CPAP, obesity, hyperlipidemia, family history of heart disease (father with stent in his 4550s), remote tobacco use.  He saw me in December 26, 2020 and mentioned occasional left-sided chest pain, elevated heart rate at night and weight loss efforts (Optavia diet).  His previous weight was up to 220.  He was down to 204 in 12/2020.  His goal weight is 180 pounds.  Nov 2022 testing: - He had a treadmill nuclear stress test achieving target heart rate with no evidence of ischemia or infarction.   - 2D echo showed EF improved to 60 to 65% up from 50 to 55% in 2019 and no evidence of significant valve disease.   - 2 weeks Zio patch showed frequent PACs and short runs of SVT.  Today, he feels well.  Chest pain and palpitations have resolved.  He continues on his NVR Incctavia diet.  He has lost a few pounds since last visit.  He denies lightheadedness, syncope, leg swelling and shortness of breath.  He states his PCP took him off metformin since his A1c is now less than 7%.    Past Medical History:  Diagnosis Date   Carpal tunnel syndrome    Chest pain    Degeneration of cervical intervertebral disc    Diabetes (HCC)    Hyperlipidemia    Obstructive sleep apnea (adult) (pediatric)    PVC's (premature ventricular contractions)    Shortness of breath     Past Surgical History:  Procedure Laterality Date   BACK SURGERY  10/2009   HERNIA REPAIR  03/2006   NECK SURGERY  11/2006   NM MYOCAR PERF WALL MOTION  11/14/2010   normal    Current Medications: Current Meds  Medication Sig   Albuterol Sulfate (PROAIR RESPICLICK) 108 (90 BASE) MCG/ACT AEPB Inhale 2 puffs into the lungs  every 6 (six) hours as needed.   atorvastatin (LIPITOR) 40 MG tablet Take 40 mg by mouth daily.   gabapentin (NEURONTIN) 300 MG capsule Take 300 mg by mouth as needed. As Needed for knee pain   SYMBICORT 160-4.5 MCG/ACT inhaler INHALE 2 PUFFS INTO THE LUNGS 2 (TWO) TIMES DAILY.   [DISCONTINUED] pantoprazole (PROTONIX) 40 MG tablet Take 1 tablet (40 mg total) by mouth daily before breakfast.     Allergies:   Celexa [citalopram] and Hydrocodone   Social History   Socioeconomic History   Marital status: Married    Spouse name: Not on file   Number of children: Not on file   Years of education: Not on file   Highest education level: Not on file  Occupational History   Occupation: Occupational hygienistetail manager  Tobacco Use   Smoking status: Former    Packs/day: 2.00    Years: 30.00    Pack years: 60.00    Types: Cigarettes    Quit date: 04/25/2010    Years since quitting: 10.8   Smokeless tobacco: Never  Vaping Use   Vaping Use: Never used  Substance and Sexual Activity   Alcohol use: Yes    Comment: 3-4 drinks per week   Drug use: No   Sexual activity: Yes  Other Topics Concern  Not on file  Social History Narrative   Not on file   Social Determinants of Health   Financial Resource Strain: Not on file  Food Insecurity: Not on file  Transportation Needs: Not on file  Physical Activity: Not on file  Stress: Not on file  Social Connections: Not on file     Family History: The patient's family history includes Breast cancer in his sister; Heart disease in his father and maternal grandfather; Lung cancer in his maternal grandmother; Pancreatic cancer in his mother.  ROS:   Please see the history of present illness.     EKGs/Labs/Other Studies Reviewed:    Recent Labs: 12/23/2020: ALT 39; BUN 21; Creatinine, Ser 1.83; Hemoglobin 15.3; Platelets 173; Potassium 4.3; Sodium 139   Recent Lipid Panel Lab Results  Component Value Date/Time   CHOL 130 12/26/2020 09:26 AM   TRIG 86  12/26/2020 09:26 AM   HDL 40 12/26/2020 09:26 AM   LDLCALC 73 12/26/2020 09:26 AM    Physical Exam:    VS:  BP 120/72    Pulse 86    Ht 5\' 9"  (1.753 m)    Wt 201 lb 6.4 oz (91.4 kg)    SpO2 97%    BMI 29.74 kg/m    No data found.  Wt Readings from Last 3 Encounters:  02/27/21 201 lb 6.4 oz (91.4 kg)  01/23/21 204 lb (92.5 kg)  12/26/20 204 lb 8 oz (92.8 kg)     GEN:  Well nourished, well developed in no acute distress HEENT: Normal NECK: No JVD; No carotid bruits CARDIAC: RRR, no murmurs, rubs, gallops RESPIRATORY:  Clear to auscultation without rales, wheezing or rhonchi  ABDOMEN: Soft, non-tender, non-distended MUSCULOSKELETAL: No edema; No deformity  SKIN: Warm and dry NEUROLOGIC:  Alert and oriented PSYCHIATRIC:  Normal affect     ASSESSMENT AND PLAN   Coronary artery disease/Aortic atherosclerosis Precordial pain, resolved -CT lung cancer screening in 2022 with aortic atherosclerosis and two-vessel CAD -Treadmill nuc 12/2020: no ischemia or evidence of infarction. -Continue statin.   -Continue weight loss efforts.  DM well controlled with A1C 6.8% in 01/2021.  Down from 8.9% in 10/2020.     SVT/PACs, asymptomatic -Patient had noticed elevated heart rate at rest approximately every 2 days.  Zio patch showed PACs and short runs of SVT. -Has PACs/SVT is now asymptomatic (with lifestyle changes), will hold off on medications at this time    Hyperlipidemia -Goal LDL less than 55 with diabetes and evidence of CAD on CT.  LDL 73 in 12/2020 by [facility]. -Recommend rechecking LDL with PCP in 6 months with ongoing weight loss efforts.  If LDL is greater than 55, would increase Lipitor or change to Crestor. -Discussed cholesterol lowering diets - Mediterranean diet, DASH diet, vegetarian diet, low-carbohydrate diet and avoidance of trans fats.  Discussed healthier choice substitutes.  Nuts, high-fiber foods, and fiber supplements may also improve lipids.     Obesity -Discussed how  even a 5-10% weight loss can have cardiovascular benefits.   -Recommend moderate intensity activity for 30 minutes 5 days/week and the DASH diet.     Disposition - Follow-up in 1 year with myself or Dr. 01/2021.  Recheck LDL in 6 months with PCP -requested that they send these results to [facility].     Medication Adjustments/Labs and Tests Ordered: Current medicines are reviewed at length with the patient today.  Concerns regarding medicines are outlined above.  No orders of the defined types were placed in  this encounter.  No orders of the defined types were placed in this encounter.   Patient Instructions  Medication Instructions:  No Changes *If you need a refill on your cardiac medications before your next appointment, please call your pharmacy*   Lab Work: No Labs If you have labs (blood work) drawn today and your tests are completely normal, you will receive your results only by: MyChart Message (if you have MyChart) OR A paper copy in the mail If you have any lab test that is abnormal or we need to change your treatment, we will call you to review the results.   Testing/Procedures: No Testing   Follow-Up: At [facility], you and your health needs are our priority.  As part of our continuing mission to provide you with exceptional heart care, we have created designated Provider Care Teams.  These Care Teams include your primary Cardiologist (physician) and Advanced Practice Providers (APPs -  Physician Assistants and Nurse Practitioners) who all work together to provide you with the care you need, when you need it.  We recommend signing up for the patient portal called "MyChart".  Sign up information is provided on this After Visit Summary.  MyChart is used to connect with patients for Virtual Visits (Telemedicine).  Patients are able to view lab/test results, encounter notes, upcoming appointments, etc.  Non-urgent messages can be sent to your provider as well.   To learn more  about what you can do with MyChart, go to [email or web address].    Your next appointment:   1 year(s)  The format for your next appointment:   In Person  Provider:   Nanetta Batty, MD     Other Instructions Fasting Lipids in 6 months with PCP. Goal LDL <55   Signed, Bernette Mayers  02/27/2021 8:32 AM     [facility]

## 2021-02-27 ENCOUNTER — Ambulatory Visit: Payer: BLUE CROSS/BLUE SHIELD | Admitting: Physician Assistant

## 2021-02-27 ENCOUNTER — Other Ambulatory Visit: Payer: Self-pay

## 2021-02-27 ENCOUNTER — Encounter: Payer: Self-pay | Admitting: Physician Assistant

## 2021-02-27 VITALS — BP 120/72 | HR 86 | Ht 69.0 in | Wt 201.4 lb

## 2021-02-27 DIAGNOSIS — I471 Supraventricular tachycardia: Secondary | ICD-10-CM | POA: Diagnosis not present

## 2021-02-27 DIAGNOSIS — E782 Mixed hyperlipidemia: Secondary | ICD-10-CM

## 2021-02-27 DIAGNOSIS — I2510 Atherosclerotic heart disease of native coronary artery without angina pectoris: Secondary | ICD-10-CM | POA: Diagnosis not present

## 2021-02-27 DIAGNOSIS — I491 Atrial premature depolarization: Secondary | ICD-10-CM

## 2021-02-27 NOTE — Patient Instructions (Signed)
Medication Instructions:  No Changes *If you need a refill on your cardiac medications before your next appointment, please call your pharmacy*   Lab Work: No Labs If you have labs (blood work) drawn today and your tests are completely normal, you will receive your results only by: MyChart Message (if you have MyChart) OR A paper copy in the mail If you have any lab test that is abnormal or we need to change your treatment, we will call you to review the results.   Testing/Procedures: No Testing   Follow-Up: At [facility], you and your health needs are our priority.  As part of our continuing mission to provide you with exceptional heart care, we have created designated Provider Care Teams.  These Care Teams include your primary Cardiologist (physician) and Advanced Practice Providers (APPs -  Physician Assistants and Nurse Practitioners) who all work together to provide you with the care you need, when you need it.  We recommend signing up for the patient portal called "MyChart".  Sign up information is provided on this After Visit Summary.  MyChart is used to connect with patients for Virtual Visits (Telemedicine).  Patients are able to view lab/test results, encounter notes, upcoming appointments, etc.  Non-urgent messages can be sent to your provider as well.   To learn more about what you can do with MyChart, go to [email or web address].    Your next appointment:   1 year(s)  The format for your next appointment:   In Person  Provider:   Nanetta Batty, MD     Other Instructions Fasting Lipids in 6 months with PCP. Goal LDL <55

## 2021-03-04 DIAGNOSIS — R972 Elevated prostate specific antigen [PSA]: Secondary | ICD-10-CM | POA: Diagnosis not present

## 2021-03-04 DIAGNOSIS — N281 Cyst of kidney, acquired: Secondary | ICD-10-CM | POA: Diagnosis not present

## 2021-03-04 DIAGNOSIS — Z87442 Personal history of urinary calculi: Secondary | ICD-10-CM | POA: Diagnosis not present

## 2021-07-23 ENCOUNTER — Ambulatory Visit
Admission: RE | Admit: 2021-07-23 | Discharge: 2021-07-23 | Disposition: A | Discharge status: Home / Self Care | Payer: BLUE CROSS/BLUE SHIELD | Source: Ambulatory Visit | Attending: Physician Assistant | Admitting: Physician Assistant

## 2021-07-23 DIAGNOSIS — Z87891 Personal history of nicotine dependence: Secondary | ICD-10-CM

## 2021-07-24 ENCOUNTER — Other Ambulatory Visit: Payer: Self-pay

## 2021-07-24 DIAGNOSIS — Z87891 Personal history of nicotine dependence: Secondary | ICD-10-CM

## 2021-07-24 DIAGNOSIS — Z122 Encounter for screening for malignant neoplasm of respiratory organs: Secondary | ICD-10-CM

## 2021-11-28 ENCOUNTER — Ambulatory Visit: Payer: BLUE CROSS/BLUE SHIELD | Admitting: Physician Assistant

## 2022-02-26 DIAGNOSIS — Z7984 Long term (current) use of oral hypoglycemic drugs: Secondary | ICD-10-CM | POA: Diagnosis not present

## 2022-02-26 DIAGNOSIS — E119 Type 2 diabetes mellitus without complications: Secondary | ICD-10-CM | POA: Diagnosis not present

## 2022-03-03 DIAGNOSIS — N281 Cyst of kidney, acquired: Secondary | ICD-10-CM | POA: Diagnosis not present

## 2022-03-03 DIAGNOSIS — R972 Elevated prostate specific antigen [PSA]: Secondary | ICD-10-CM | POA: Diagnosis not present

## 2022-06-04 DIAGNOSIS — E782 Mixed hyperlipidemia: Secondary | ICD-10-CM | POA: Diagnosis not present

## 2022-06-04 DIAGNOSIS — J4540 Moderate persistent asthma, uncomplicated: Secondary | ICD-10-CM | POA: Diagnosis not present

## 2022-06-04 DIAGNOSIS — Z23 Encounter for immunization: Secondary | ICD-10-CM | POA: Diagnosis not present

## 2022-06-04 DIAGNOSIS — E119 Type 2 diabetes mellitus without complications: Secondary | ICD-10-CM | POA: Diagnosis not present

## 2022-06-04 DIAGNOSIS — M79652 Pain in left thigh: Secondary | ICD-10-CM | POA: Diagnosis not present

## 2022-06-05 DIAGNOSIS — S76302A Unspecified injury of muscle, fascia and tendon of the posterior muscle group at thigh level, left thigh, initial encounter: Secondary | ICD-10-CM | POA: Diagnosis not present

## 2022-06-05 DIAGNOSIS — M79652 Pain in left thigh: Secondary | ICD-10-CM | POA: Diagnosis not present

## 2022-06-09 DIAGNOSIS — M6281 Muscle weakness (generalized): Secondary | ICD-10-CM | POA: Diagnosis not present

## 2022-06-09 DIAGNOSIS — S76302A Unspecified injury of muscle, fascia and tendon of the posterior muscle group at thigh level, left thigh, initial encounter: Secondary | ICD-10-CM | POA: Diagnosis not present

## 2022-06-12 DIAGNOSIS — E782 Mixed hyperlipidemia: Secondary | ICD-10-CM | POA: Diagnosis not present

## 2022-06-12 DIAGNOSIS — E119 Type 2 diabetes mellitus without complications: Secondary | ICD-10-CM | POA: Diagnosis not present

## 2022-06-12 DIAGNOSIS — M6281 Muscle weakness (generalized): Secondary | ICD-10-CM | POA: Diagnosis not present

## 2022-06-12 DIAGNOSIS — S76302A Unspecified injury of muscle, fascia and tendon of the posterior muscle group at thigh level, left thigh, initial encounter: Secondary | ICD-10-CM | POA: Diagnosis not present

## 2022-06-17 DIAGNOSIS — S76302A Unspecified injury of muscle, fascia and tendon of the posterior muscle group at thigh level, left thigh, initial encounter: Secondary | ICD-10-CM | POA: Diagnosis not present

## 2022-06-17 DIAGNOSIS — M6281 Muscle weakness (generalized): Secondary | ICD-10-CM | POA: Diagnosis not present

## 2022-06-19 DIAGNOSIS — S76302A Unspecified injury of muscle, fascia and tendon of the posterior muscle group at thigh level, left thigh, initial encounter: Secondary | ICD-10-CM | POA: Diagnosis not present

## 2022-06-19 DIAGNOSIS — M6281 Muscle weakness (generalized): Secondary | ICD-10-CM | POA: Diagnosis not present

## 2022-06-26 ENCOUNTER — Other Ambulatory Visit: Payer: Self-pay | Admitting: Acute Care

## 2022-06-26 DIAGNOSIS — Z87891 Personal history of nicotine dependence: Secondary | ICD-10-CM

## 2022-06-26 DIAGNOSIS — Z122 Encounter for screening for malignant neoplasm of respiratory organs: Secondary | ICD-10-CM

## 2022-07-01 DIAGNOSIS — M6281 Muscle weakness (generalized): Secondary | ICD-10-CM | POA: Diagnosis not present

## 2022-07-01 DIAGNOSIS — S76302A Unspecified injury of muscle, fascia and tendon of the posterior muscle group at thigh level, left thigh, initial encounter: Secondary | ICD-10-CM | POA: Diagnosis not present

## 2022-07-04 DIAGNOSIS — S76302A Unspecified injury of muscle, fascia and tendon of the posterior muscle group at thigh level, left thigh, initial encounter: Secondary | ICD-10-CM | POA: Diagnosis not present

## 2022-07-04 DIAGNOSIS — M6281 Muscle weakness (generalized): Secondary | ICD-10-CM | POA: Diagnosis not present

## 2022-07-08 DIAGNOSIS — S76302A Unspecified injury of muscle, fascia and tendon of the posterior muscle group at thigh level, left thigh, initial encounter: Secondary | ICD-10-CM | POA: Diagnosis not present

## 2022-07-08 DIAGNOSIS — M6281 Muscle weakness (generalized): Secondary | ICD-10-CM | POA: Diagnosis not present

## 2022-07-22 DIAGNOSIS — M6281 Muscle weakness (generalized): Secondary | ICD-10-CM | POA: Diagnosis not present

## 2022-07-22 DIAGNOSIS — S76302A Unspecified injury of muscle, fascia and tendon of the posterior muscle group at thigh level, left thigh, initial encounter: Secondary | ICD-10-CM | POA: Diagnosis not present

## 2022-07-28 ENCOUNTER — Ambulatory Visit
Admission: RE | Admit: 2022-07-28 | Discharge: 2022-07-28 | Disposition: A | Discharge status: Home / Self Care | Payer: BLUE CROSS/BLUE SHIELD | Source: Ambulatory Visit | Attending: Physician Assistant | Admitting: Physician Assistant

## 2022-07-28 DIAGNOSIS — Z87891 Personal history of nicotine dependence: Secondary | ICD-10-CM

## 2022-07-28 DIAGNOSIS — Z122 Encounter for screening for malignant neoplasm of respiratory organs: Secondary | ICD-10-CM

## 2022-07-29 DIAGNOSIS — M6281 Muscle weakness (generalized): Secondary | ICD-10-CM | POA: Diagnosis not present

## 2022-07-29 DIAGNOSIS — S76302A Unspecified injury of muscle, fascia and tendon of the posterior muscle group at thigh level, left thigh, initial encounter: Secondary | ICD-10-CM | POA: Diagnosis not present

## 2022-08-04 ENCOUNTER — Other Ambulatory Visit: Payer: Self-pay | Admitting: Acute Care

## 2022-08-04 DIAGNOSIS — S76302A Unspecified injury of muscle, fascia and tendon of the posterior muscle group at thigh level, left thigh, initial encounter: Secondary | ICD-10-CM | POA: Diagnosis not present

## 2022-08-04 DIAGNOSIS — M6281 Muscle weakness (generalized): Secondary | ICD-10-CM | POA: Diagnosis not present

## 2022-08-04 DIAGNOSIS — Z87891 Personal history of nicotine dependence: Secondary | ICD-10-CM

## 2022-08-04 DIAGNOSIS — Z122 Encounter for screening for malignant neoplasm of respiratory organs: Secondary | ICD-10-CM

## 2022-08-07 DIAGNOSIS — M6281 Muscle weakness (generalized): Secondary | ICD-10-CM | POA: Diagnosis not present

## 2022-08-07 DIAGNOSIS — S76302A Unspecified injury of muscle, fascia and tendon of the posterior muscle group at thigh level, left thigh, initial encounter: Secondary | ICD-10-CM | POA: Diagnosis not present

## 2022-08-11 DIAGNOSIS — R5383 Other fatigue: Secondary | ICD-10-CM | POA: Diagnosis not present

## 2022-08-11 DIAGNOSIS — U071 COVID-19: Secondary | ICD-10-CM | POA: Diagnosis not present

## 2022-08-11 DIAGNOSIS — J029 Acute pharyngitis, unspecified: Secondary | ICD-10-CM | POA: Diagnosis not present

## 2022-08-11 DIAGNOSIS — R059 Cough, unspecified: Secondary | ICD-10-CM | POA: Diagnosis not present

## 2022-08-11 DIAGNOSIS — R509 Fever, unspecified: Secondary | ICD-10-CM | POA: Diagnosis not present

## 2022-08-26 IMAGING — US US EXTREM LOW VENOUS
1 series · 12 of 24 positions shown · non-contrast
Comparison: None.

CLINICAL DATA: Right greater than left lower extremity swelling,
post COVID, concern for venous insufficiency and varicose veins



[Series 1: us extrem low venous · 0.11mm/px · 12 of 52 slices shown]
[im 3/52]
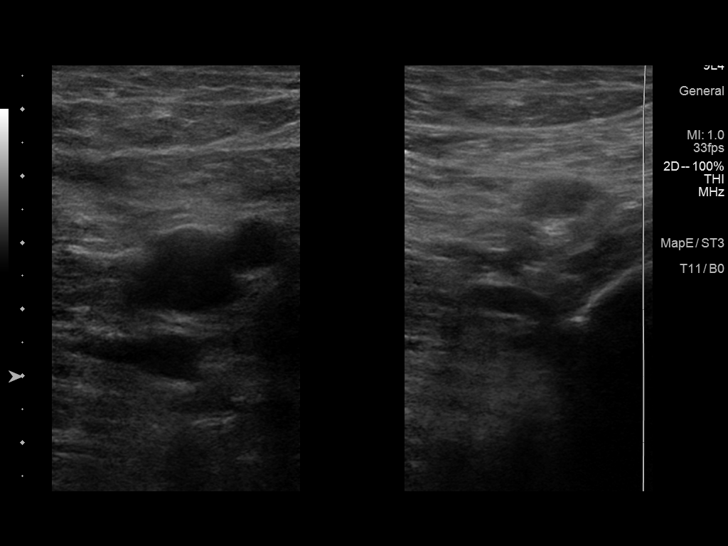
[im 7/52]
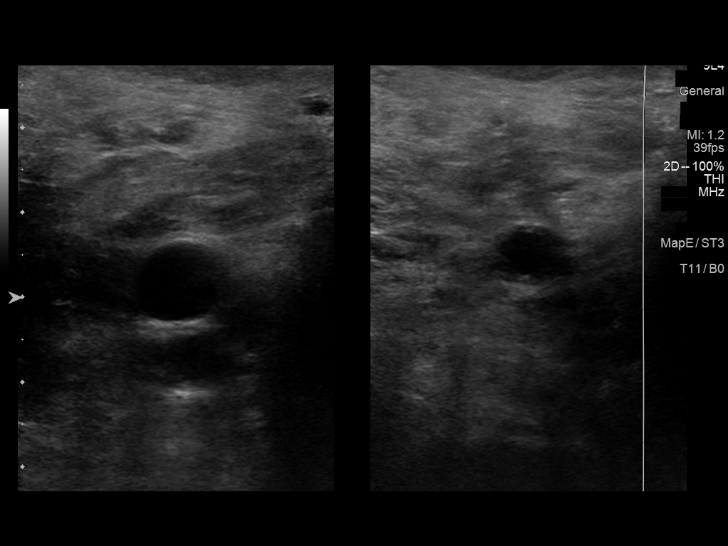
[im 12/52]
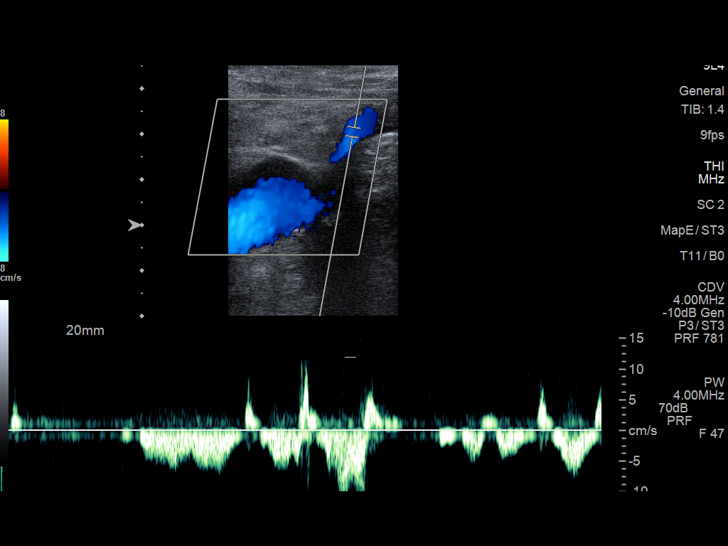
[im 16/52]
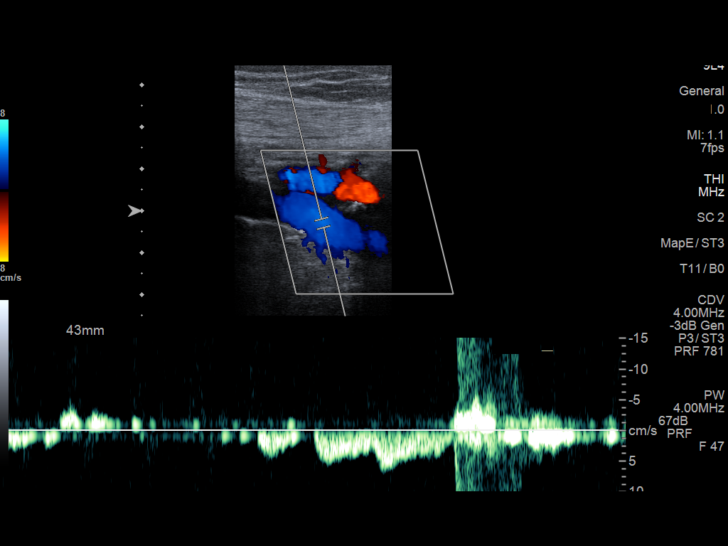
[im 20/52]
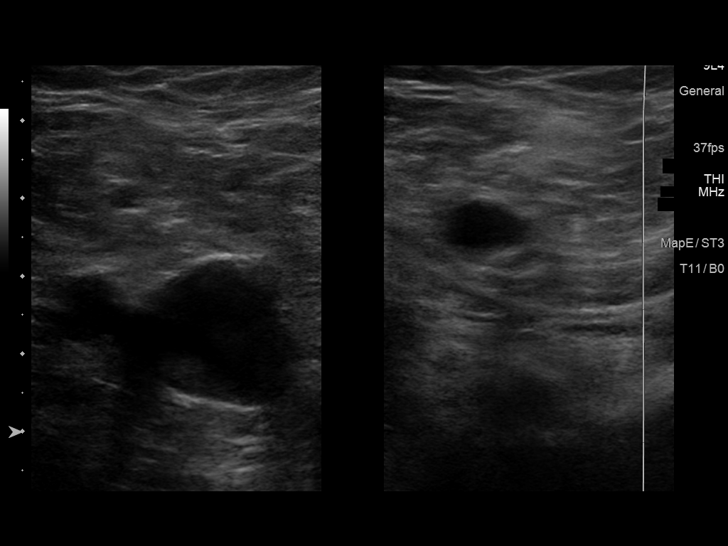
[im 25/52]
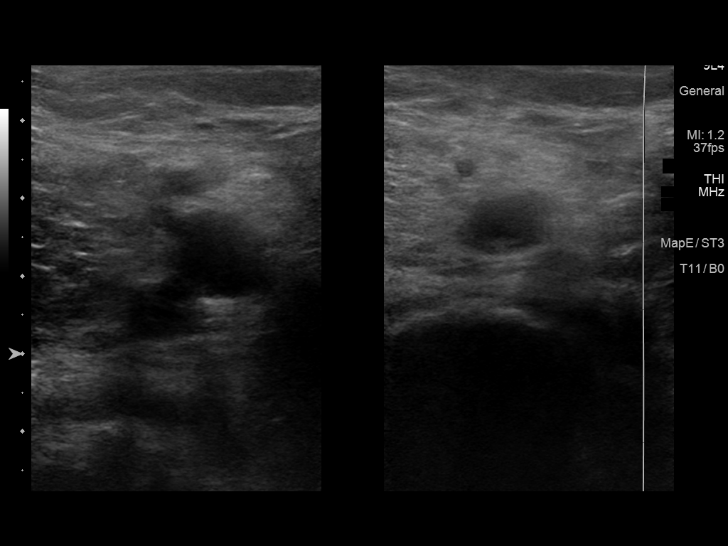
[im 29/52]
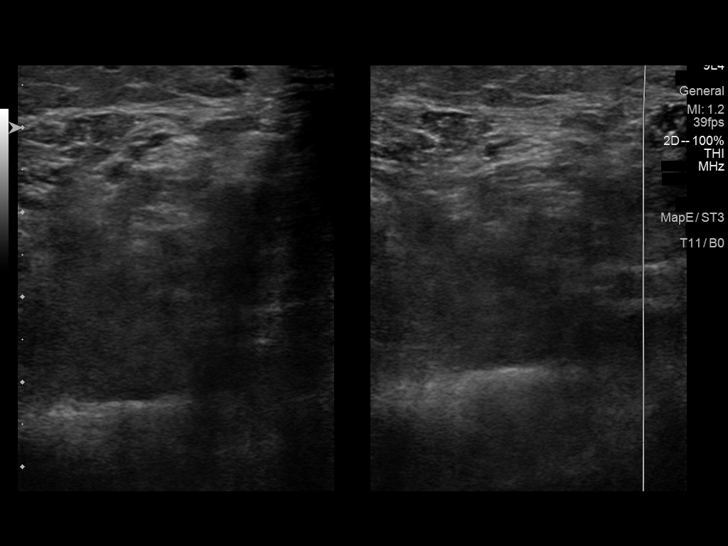
[im 34/52]
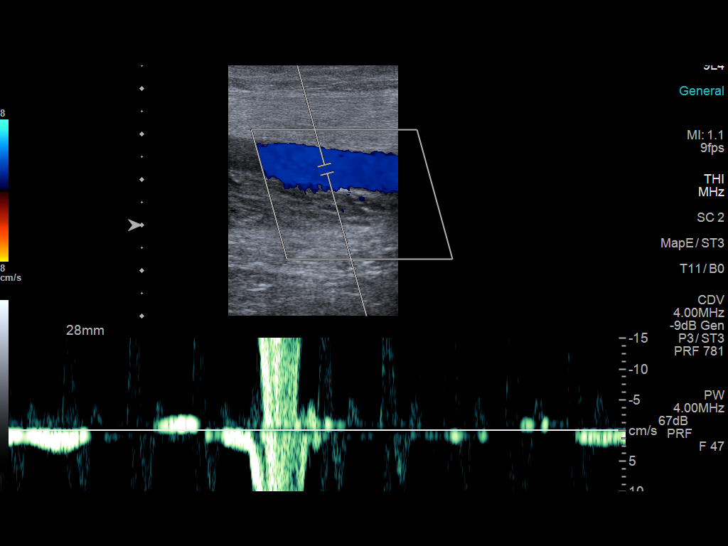
[im 38/52]
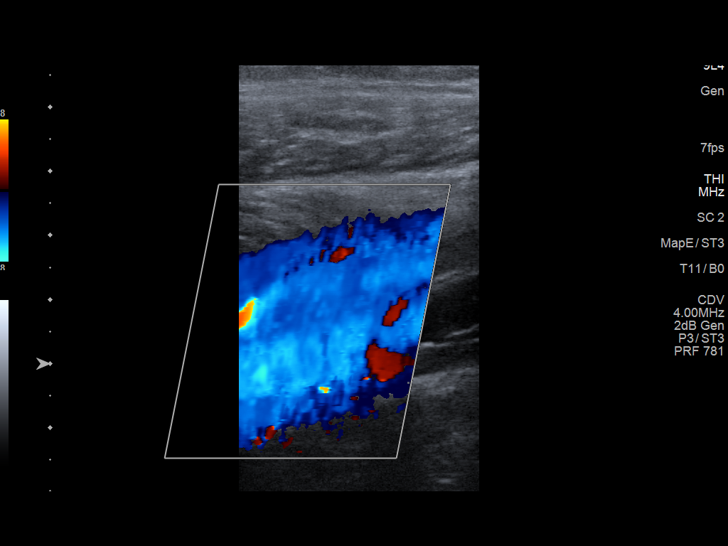
[im 43/52]
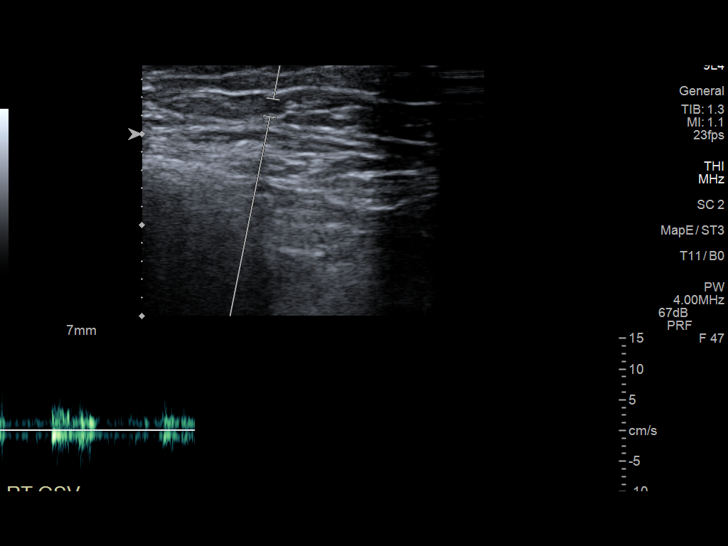
[im 47/52]
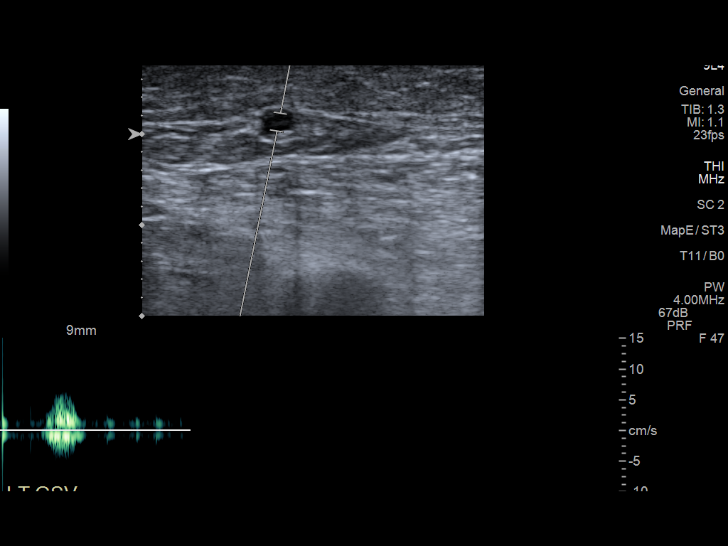
[im 52/52]
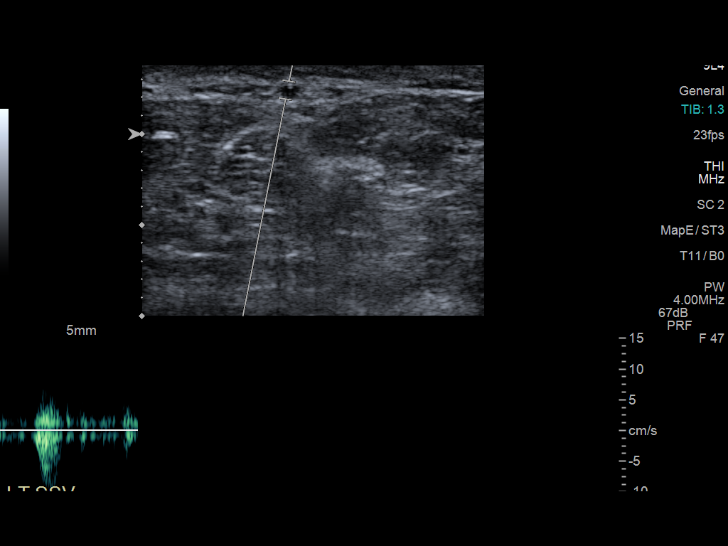

[12 of 24 positions shown; findings below may reference images not displayed]

FINDINGS: RIGHT LOWER EXTREMITY

Common Femoral Vein: No evidence of thrombus. Normal
compressibility, respiratory phasicity and response to augmentation.

Saphenofemoral Junction: No evidence of thrombus. Normal
compressibility and flow on color Doppler imaging. Negative for
venous insufficiency or reflux.

Profunda Femoral Vein: No evidence of thrombus. Normal
compressibility and flow on color Doppler imaging.

Femoral Vein: No evidence of thrombus. Normal compressibility,
respiratory phasicity and response to augmentation.

Popliteal Vein: No evidence of thrombus. Normal compressibility,
respiratory phasicity and response to augmentation.

Calf Veins: No evidence of thrombus. Normal compressibility and flow
on color Doppler imaging.

Superficial Great Saphenous Vein: No evidence of thrombus. Normal
compressibility. Negative for venous insufficiency or reflux.

Small saphenous vein: Negative for thrombus. Normal compressibility.
Negative for venous insufficiency or reflux.

Venous Reflux:  None.

Other Findings:  No sub surface varicosities demonstrated.

LEFT LOWER EXTREMITY

Common Femoral Vein: No evidence of thrombus. Normal
compressibility, respiratory phasicity and response to augmentation.

Saphenofemoral Junction: No evidence of thrombus. Normal
compressibility and flow on color Doppler imaging. Negative for
venous insufficiency or reflux.

Profunda Femoral Vein: No evidence of thrombus. Normal
compressibility and flow on color Doppler imaging.

Femoral Vein: No evidence of thrombus. Normal compressibility,
respiratory phasicity and response to augmentation.

Popliteal Vein: No evidence of thrombus. Normal compressibility,
respiratory phasicity and response to augmentation.

Calf Veins: No evidence of thrombus. Normal compressibility and flow
on color Doppler imaging.

Superficial Great Saphenous Vein: No evidence of thrombus. Normal
compressibility. Negative for venous insufficiency or reflux.

Small saphenous vein: Negative for thrombus. Normal compressibility.
Negative for reflux or venous insufficiency.

Venous Reflux:  None.

Other Findings:  No sub surface varicosities.
IMPRESSION: No evidence of deep venous thrombosis in either lower extremity.

Normal saphenous venous anatomy. Negative for venous insufficiency
or reflux. No sub surface varicosities.

## 2022-10-17 IMAGING — CT CT RENAL STONE PROTOCOL
2 of 4 series · 17 of 46 positions shown, 19 images · non-contrast
Comparison: None.

CLINICAL DATA: Flank pain

EXAM:
CT ABDOMEN AND PELVIS WITHOUT CONTRAST
TECHNIQUE: Multidetector CT imaging of the abdomen and pelvis was performed
following the standard protocol without IV contrast.

[Series 2: stone full · axial · 0.80mm/px · z∈[-514,-79]mm · 14 of 95 slices shown, 16 images]
[im 4/95  soft-tissue]
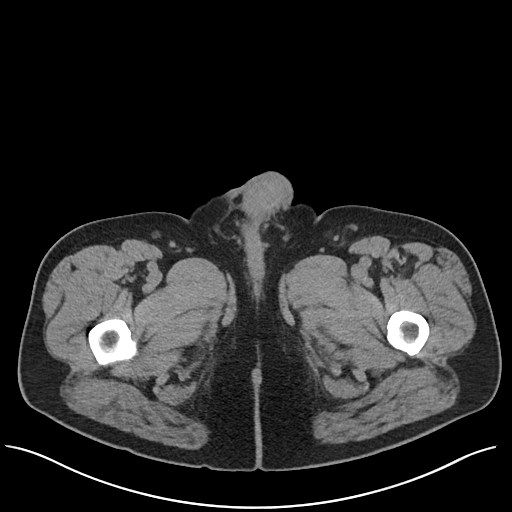
[im 4/95  bone]
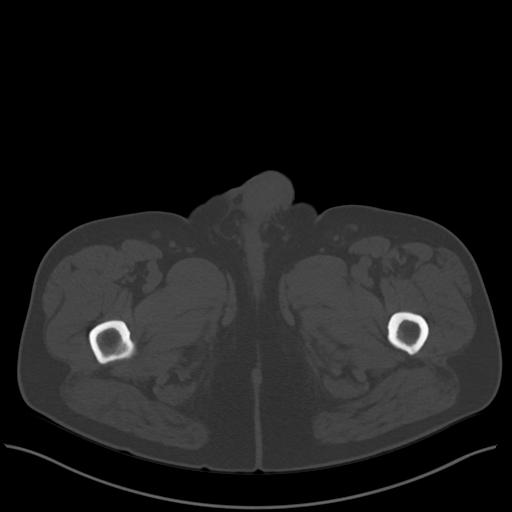
[im 11/95  soft-tissue]
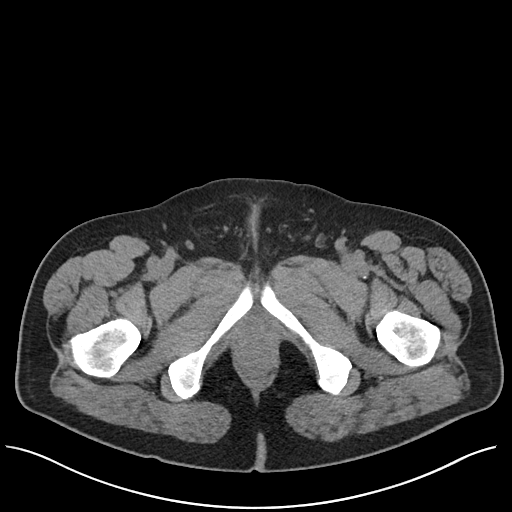
[im 18/95  soft-tissue]
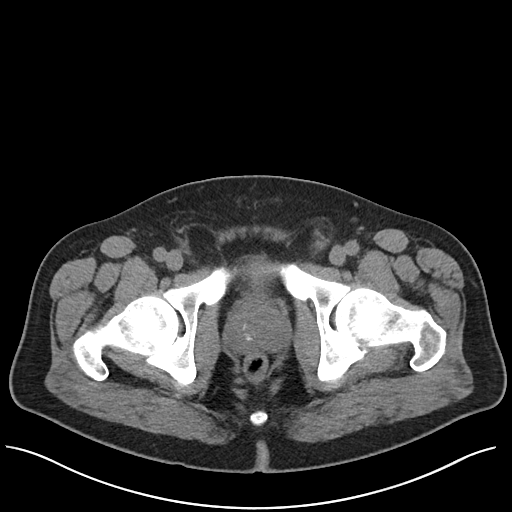
[im 25/95  soft-tissue]
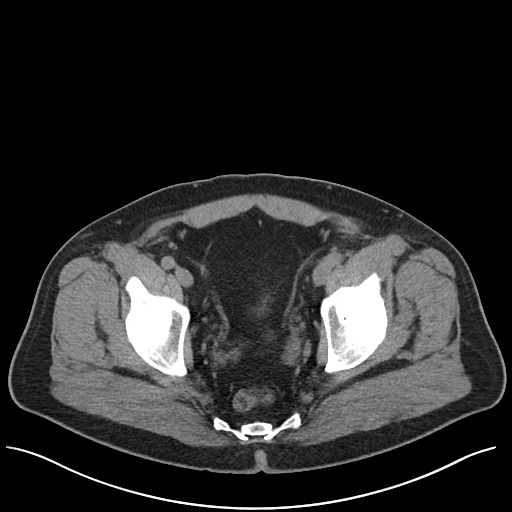
[im 32/95  soft-tissue]
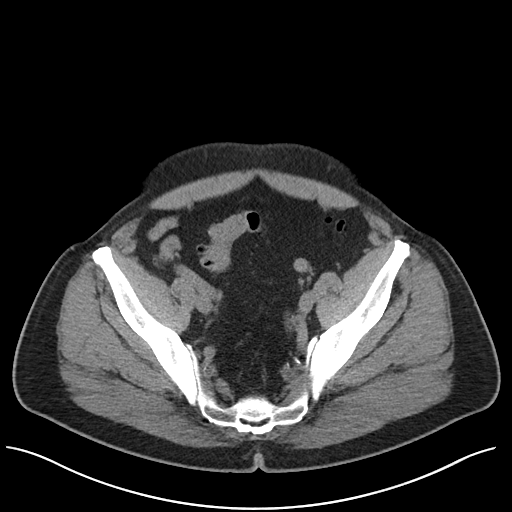
[im 39/95  soft-tissue]
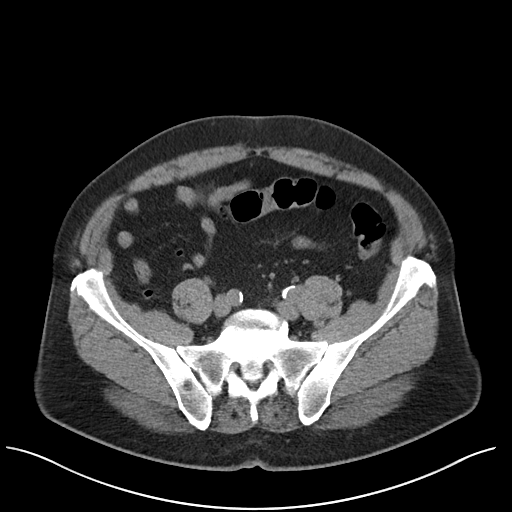
[im 46/95  soft-tissue]
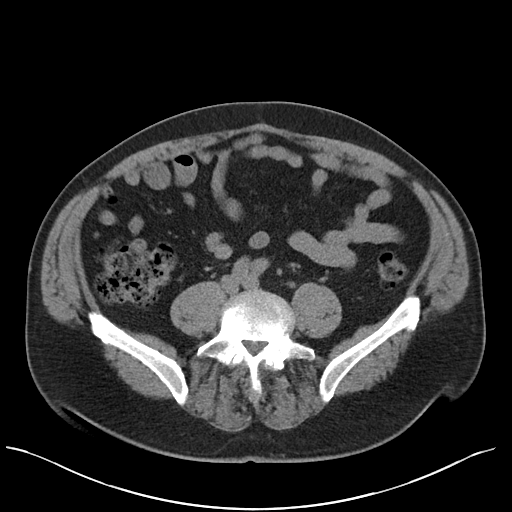
[im 49/95  soft-tissue]
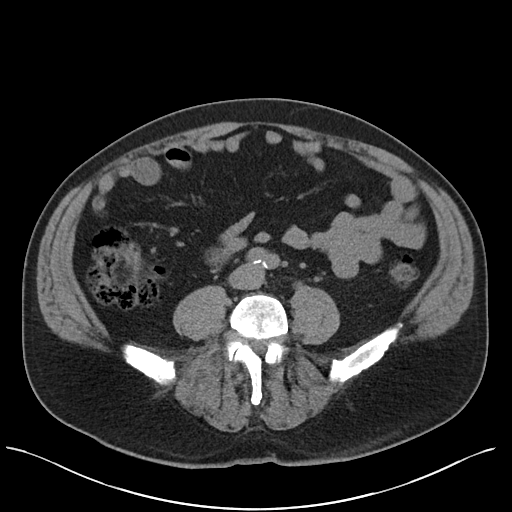
[im 56/95  soft-tissue]
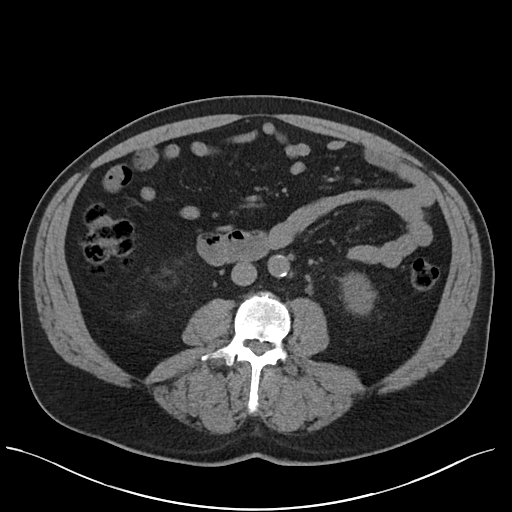
[im 56/95  bone]
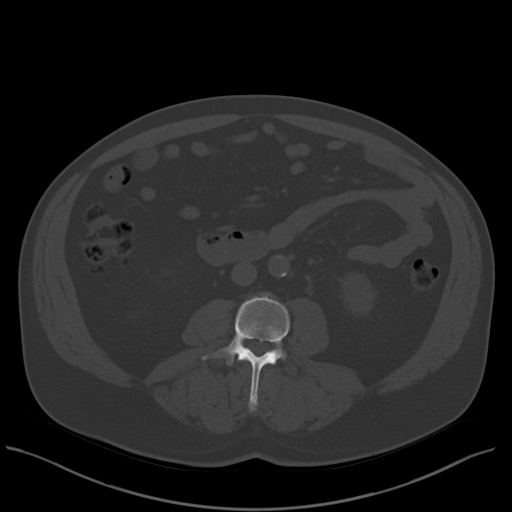
[im 63/95  soft-tissue]
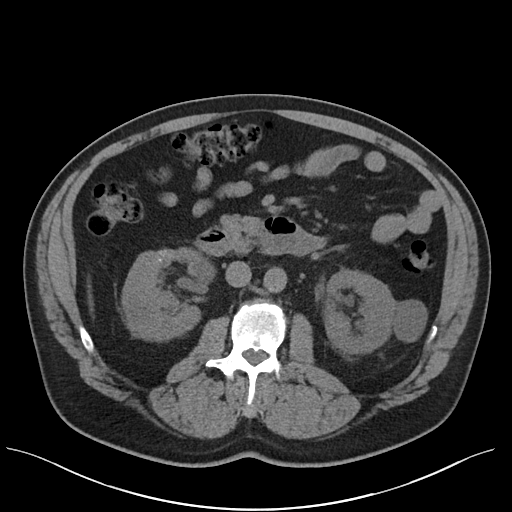
[im 70/95  soft-tissue]
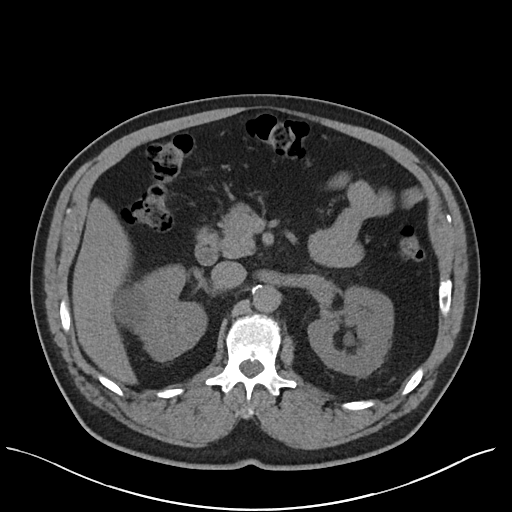
[im 77/95  soft-tissue]
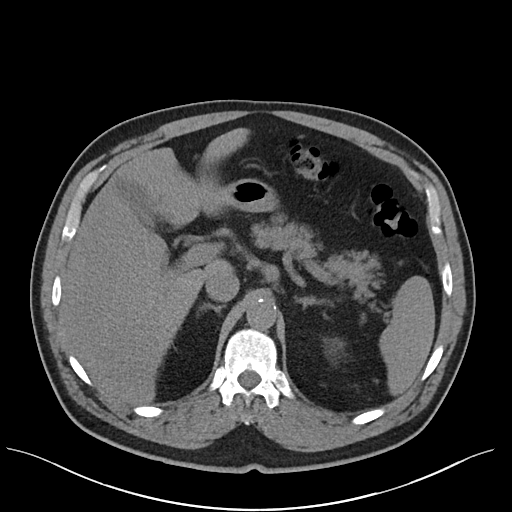
[im 84/95  soft-tissue]
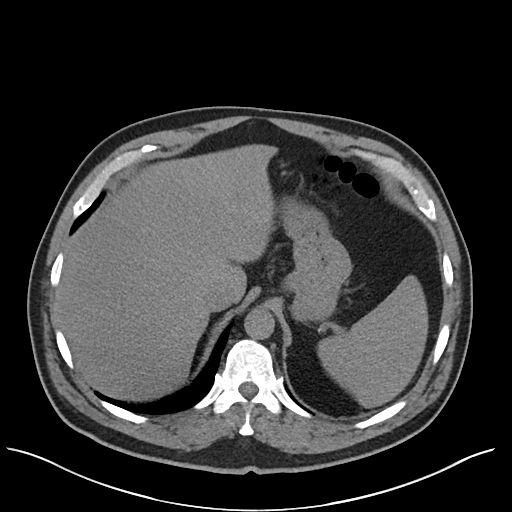
[im 91/95  soft-tissue]
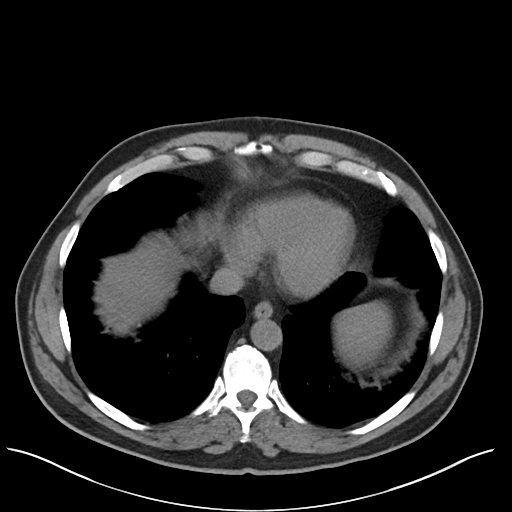

[Series 5: coronal · coronal · 0.89mm/px · 3 of 104 slices shown]
[im 35/104  soft-tissue]
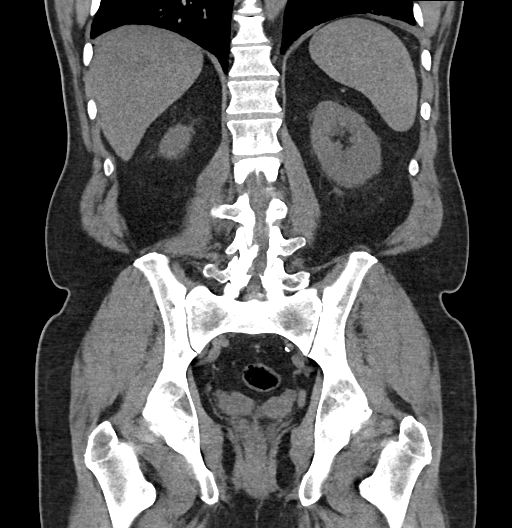
[im 46/104  soft-tissue]
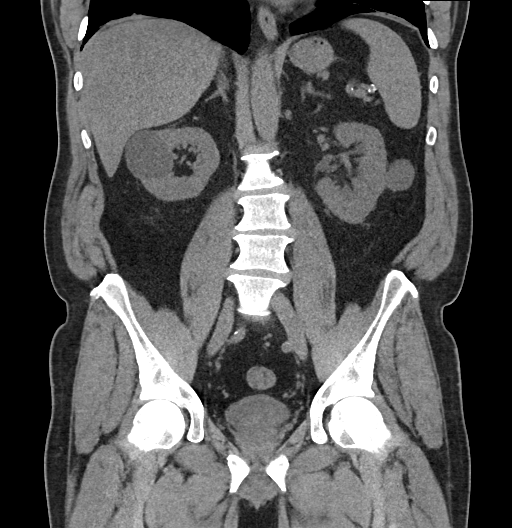
[im 58/104  soft-tissue]
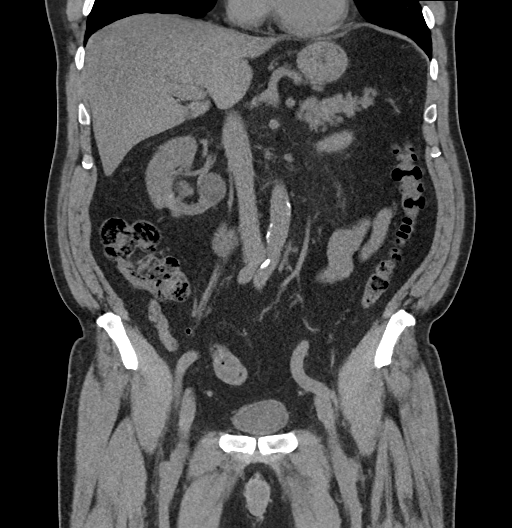

[17 of 46 positions shown; findings below may reference images not displayed]

FINDINGS: Lower chest: Lung bases are clear.

Hepatobiliary: No focal hepatic lesion. No biliary duct dilatation.
Common bile duct is normal.

Pancreas: Pancreas is normal. No ductal dilatation. No pancreatic
inflammation.

Spleen: Normal spleen

Adrenals/urinary tract: Adrenal glands are normal. Simple fluid
attenuation cysts of the kidneys. Benign periphery calcified lesion
in the cortex of the RIGHT.

There is mild ureteral dilatation on the LEFT. There is a very small
partially obstructing calculus in the distal LEFT ureter measuring 1
mm (image 73/series 2). This distal LEFT ureteral calculus is
approximately 1 cm from the LEFT vesicoureteral junction. No bladder
calculi

No RIGHT nephrolithiasis or ureterolithiasis.

Stomach/Bowel: Stomach, small bowel, appendix, and cecum are normal.
The colon and rectosigmoid colon are normal.

Vascular/Lymphatic: Abdominal aorta is normal caliber with
atherosclerotic calcification. There is no retroperitoneal or
periportal lymphadenopathy. No pelvic lymphadenopathy.

Reproductive: Prostate normal

Other: No free fluid.

Musculoskeletal: No aggressive osseous lesion.
IMPRESSION: 1. Small nonobstructing calculus in the distal LEFT ureter just
outside the vesicoureteral junction.
2. Bilateral benign-appearing renal cysts and calcifications.
3.  Aortic Atherosclerosis ([id number]-ZPT.T).

## 2022-11-17 DIAGNOSIS — Z7189 Other specified counseling: Secondary | ICD-10-CM | POA: Diagnosis not present

## 2022-11-17 DIAGNOSIS — D485 Neoplasm of uncertain behavior of skin: Secondary | ICD-10-CM | POA: Diagnosis not present

## 2022-11-17 DIAGNOSIS — C44712 Basal cell carcinoma of skin of right lower limb, including hip: Secondary | ICD-10-CM | POA: Diagnosis not present

## 2022-11-17 DIAGNOSIS — D225 Melanocytic nevi of trunk: Secondary | ICD-10-CM | POA: Diagnosis not present

## 2022-11-17 DIAGNOSIS — L814 Other melanin hyperpigmentation: Secondary | ICD-10-CM | POA: Diagnosis not present

## 2022-11-17 DIAGNOSIS — L821 Other seborrheic keratosis: Secondary | ICD-10-CM | POA: Diagnosis not present

## 2022-11-26 DIAGNOSIS — M1712 Unilateral primary osteoarthritis, left knee: Secondary | ICD-10-CM | POA: Diagnosis not present

## 2022-11-26 DIAGNOSIS — M25562 Pain in left knee: Secondary | ICD-10-CM | POA: Diagnosis not present

## 2022-11-27 DIAGNOSIS — C44712 Basal cell carcinoma of skin of right lower limb, including hip: Secondary | ICD-10-CM | POA: Diagnosis not present

## 2022-12-16 DIAGNOSIS — R351 Nocturia: Secondary | ICD-10-CM | POA: Diagnosis not present

## 2022-12-16 DIAGNOSIS — R350 Frequency of micturition: Secondary | ICD-10-CM | POA: Diagnosis not present

## 2023-02-10 DIAGNOSIS — Z23 Encounter for immunization: Secondary | ICD-10-CM | POA: Diagnosis not present

## 2023-02-10 DIAGNOSIS — E119 Type 2 diabetes mellitus without complications: Secondary | ICD-10-CM | POA: Diagnosis not present

## 2023-02-10 DIAGNOSIS — Z7985 Long-term (current) use of injectable non-insulin antidiabetic drugs: Secondary | ICD-10-CM | POA: Diagnosis not present

## 2023-02-10 DIAGNOSIS — R351 Nocturia: Secondary | ICD-10-CM | POA: Diagnosis not present

## 2023-02-10 DIAGNOSIS — Z79899 Other long term (current) drug therapy: Secondary | ICD-10-CM | POA: Diagnosis not present

## 2023-08-03 ENCOUNTER — Ambulatory Visit
Admission: RE | Admit: 2023-08-03 | Discharge: 2023-08-03 | Disposition: A | Discharge status: Home / Self Care | Payer: Self-pay | Source: Ambulatory Visit | Attending: Physician Assistant | Admitting: Physician Assistant

## 2023-08-03 DIAGNOSIS — Z122 Encounter for screening for malignant neoplasm of respiratory organs: Secondary | ICD-10-CM

## 2023-08-03 DIAGNOSIS — Z87891 Personal history of nicotine dependence: Secondary | ICD-10-CM

## 2023-08-17 ENCOUNTER — Other Ambulatory Visit: Payer: Self-pay

## 2023-08-17 ENCOUNTER — Telehealth: Payer: Self-pay

## 2023-08-17 DIAGNOSIS — Z87891 Personal history of nicotine dependence: Secondary | ICD-10-CM

## 2023-08-17 DIAGNOSIS — Z122 Encounter for screening for malignant neoplasm of respiratory organs: Secondary | ICD-10-CM

## 2023-08-17 NOTE — Telephone Encounter (Signed)
 Reviewed recent CT results with patient. He will complete a follow up scan 10/01/2023 at GI. Denies illness at time of scan. Order placed. Results and plan to PCP.   IMPRESSION: 1. New patchy ground-glass in both upper lobes, likely infectious/inflammatory in etiology. Lung-RADS 0, incomplete. Recommend follow-up low-dose lung cancer screening CT in 1-2 months to ensure resolution. 2. Aortic atherosclerosis (ICD10-I70.0). Coronary artery calcification. 3.  Emphysema (ICD10-J43.9).

## 2023-09-30 ENCOUNTER — Other Ambulatory Visit: Payer: Self-pay

## 2023-09-30 ENCOUNTER — Telehealth: Payer: Self-pay

## 2023-09-30 DIAGNOSIS — R911 Solitary pulmonary nodule: Secondary | ICD-10-CM

## 2023-09-30 DIAGNOSIS — Z122 Encounter for screening for malignant neoplasm of respiratory organs: Secondary | ICD-10-CM

## 2023-09-30 DIAGNOSIS — Z87891 Personal history of nicotine dependence: Secondary | ICD-10-CM

## 2023-09-30 NOTE — Telephone Encounter (Signed)
Order has been authorized

## 2023-09-30 NOTE — Telephone Encounter (Signed)
 Copied from CRM #[id number]. Topic: Clinical - Request for Lab/Test Order >> Sep 30, 2023  8:07 AM Corean SAUNDERS wrote: Reason for CRM: Janella from [facility] states this patient has a CT scheduled with [facility] tomorrow 8/7 but when [place] [facility] scheduled the appointment they did not attach the full order to the appointment and is requesting this be done before the patients appointment tomorrow.  Routing this to the [facility] nurses.

## 2023-09-30 NOTE — Telephone Encounter (Signed)
 Order has been linked. Message sent to [facility] to confirm PA was completed.   [name], let me know if he needs anything completed on our end. Thanks.   [name]

## 2023-10-01 ENCOUNTER — Ambulatory Visit
Admission: RE | Admit: 2023-10-01 | Discharge: 2023-10-01 | Disposition: A | Discharge status: Home / Self Care | Source: Ambulatory Visit | Attending: Acute Care | Admitting: Acute Care

## 2023-10-01 DIAGNOSIS — Z122 Encounter for screening for malignant neoplasm of respiratory organs: Secondary | ICD-10-CM

## 2023-10-01 DIAGNOSIS — Z87891 Personal history of nicotine dependence: Secondary | ICD-10-CM

## 2023-10-01 DIAGNOSIS — R911 Solitary pulmonary nodule: Secondary | ICD-10-CM

## 2023-10-14 ENCOUNTER — Other Ambulatory Visit: Payer: Self-pay | Admitting: Acute Care

## 2023-10-14 DIAGNOSIS — Z122 Encounter for screening for malignant neoplasm of respiratory organs: Secondary | ICD-10-CM

## 2023-10-14 DIAGNOSIS — Z87891 Personal history of nicotine dependence: Secondary | ICD-10-CM

## 2023-11-16 NOTE — Progress Notes (Signed)
 Scott Mccarty

## 2023-12-08 ENCOUNTER — Other Ambulatory Visit: Payer: Self-pay

## 2023-12-08 ENCOUNTER — Emergency Department (HOSPITAL_BASED_OUTPATIENT_CLINIC_OR_DEPARTMENT_OTHER): Admitting: Radiology

## 2023-12-08 ENCOUNTER — Encounter (HOSPITAL_BASED_OUTPATIENT_CLINIC_OR_DEPARTMENT_OTHER): Payer: Self-pay | Admitting: *Deleted

## 2023-12-08 ENCOUNTER — Other Ambulatory Visit (HOSPITAL_BASED_OUTPATIENT_CLINIC_OR_DEPARTMENT_OTHER): Payer: Self-pay

## 2023-12-08 ENCOUNTER — Emergency Department (HOSPITAL_BASED_OUTPATIENT_CLINIC_OR_DEPARTMENT_OTHER)
Admission: EM | Admit: 2023-12-08 | Discharge: 2023-12-08 | Disposition: A | Discharge status: Home / Self Care | Attending: Emergency Medicine | Admitting: Emergency Medicine

## 2023-12-08 ENCOUNTER — Emergency Department (HOSPITAL_BASED_OUTPATIENT_CLINIC_OR_DEPARTMENT_OTHER)

## 2023-12-08 DIAGNOSIS — R079 Chest pain, unspecified: Secondary | ICD-10-CM | POA: Diagnosis present

## 2023-12-08 DIAGNOSIS — K5730 Diverticulosis of large intestine without perforation or abscess without bleeding: Secondary | ICD-10-CM | POA: Insufficient documentation

## 2023-12-08 DIAGNOSIS — R072 Precordial pain: Secondary | ICD-10-CM

## 2023-12-08 DIAGNOSIS — E119 Type 2 diabetes mellitus without complications: Secondary | ICD-10-CM | POA: Insufficient documentation

## 2023-12-08 DIAGNOSIS — N400 Enlarged prostate without lower urinary tract symptoms: Secondary | ICD-10-CM | POA: Insufficient documentation

## 2023-12-08 DIAGNOSIS — I708 Atherosclerosis of other arteries: Secondary | ICD-10-CM | POA: Diagnosis not present

## 2023-12-08 DIAGNOSIS — N200 Calculus of kidney: Secondary | ICD-10-CM | POA: Diagnosis not present

## 2023-12-08 DIAGNOSIS — J189 Pneumonia, unspecified organism: Secondary | ICD-10-CM

## 2023-12-08 DIAGNOSIS — J181 Lobar pneumonia, unspecified organism: Secondary | ICD-10-CM | POA: Diagnosis not present

## 2023-12-08 DIAGNOSIS — J168 Pneumonia due to other specified infectious organisms: Secondary | ICD-10-CM | POA: Diagnosis not present

## 2023-12-08 DIAGNOSIS — D72829 Elevated white blood cell count, unspecified: Secondary | ICD-10-CM | POA: Diagnosis not present

## 2023-12-08 LAB — HEPATIC FUNCTION PANEL
ALT: 25 U/L (ref 0–44)
AST: 19 U/L (ref 15–41)
Albumin: 4.7 g/dL (ref 3.5–5.0)
Alkaline Phosphatase: 85 U/L (ref 38–126)
Bilirubin, Direct: 0.3 mg/dL — ABNORMAL HIGH (ref 0.0–0.2)
Indirect Bilirubin: 0.6 mg/dL (ref 0.3–0.9)
Total Bilirubin: 0.9 mg/dL (ref 0.0–1.2)
Total Protein: 7.4 g/dL (ref 6.5–8.1)

## 2023-12-08 LAB — CBC
HCT: 46.5 % (ref 39.0–52.0)
Hemoglobin: 16.1 g/dL (ref 13.0–17.0)
MCH: 31.8 pg (ref 26.0–34.0)
MCHC: 34.6 g/dL (ref 30.0–36.0)
MCV: 91.7 fL (ref 80.0–100.0)
Platelets: 157 K/uL (ref 150–400)
RBC: 5.07 MIL/uL (ref 4.22–5.81)
RDW: 13.3 % (ref 11.5–15.5)
WBC: 13.6 K/uL — ABNORMAL HIGH (ref 4.0–10.5)
nRBC: 0 % (ref 0.0–0.2)

## 2023-12-08 LAB — RESP PANEL BY RT-PCR (RSV, FLU A&B, COVID)  RVPGX2
Influenza A by PCR: NEGATIVE
Influenza B by PCR: NEGATIVE
Resp Syncytial Virus by PCR: NEGATIVE
SARS Coronavirus 2 by RT PCR: NEGATIVE

## 2023-12-08 LAB — BASIC METABOLIC PANEL WITH GFR
Anion gap: 13 (ref 5–15)
BUN: 12 mg/dL (ref 8–23)
CO2: 24 mmol/L (ref 22–32)
Calcium: 10.4 mg/dL — ABNORMAL HIGH (ref 8.9–10.3)
Chloride: 100 mmol/L (ref 98–111)
Creatinine, Ser: 0.93 mg/dL (ref 0.61–1.24)
GFR, Estimated: >60 mL/min (ref >60)
Glucose, Bld: 147 mg/dL — ABNORMAL HIGH (ref 70–99)
Potassium: 4.1 mmol/L (ref 3.5–5.1)
Sodium: 138 mmol/L (ref 135–145)

## 2023-12-08 LAB — TROPONIN T, HIGH SENSITIVITY
Troponin T High Sensitivity: <15 ng/L (ref 0–19)
Troponin T High Sensitivity: <15 ng/L (ref 0–19)

## 2023-12-08 LAB — LIPASE, BLOOD: Lipase: 29 U/L (ref 11–51)

## 2023-12-08 MED ORDER — IOHEXOL 350 MG/ML SOLN
75.0000 mL | Freq: Once | INTRAVENOUS | Status: AC | PRN
  Administered 2023-12-08: 75 mL via INTRAVENOUS

## 2023-12-08 MED ORDER — ASPIRIN 81 MG PO CHEW
324.0000 mg | CHEWABLE_TABLET | Freq: Once | ORAL | Status: AC
  Administered 2023-12-08: 324 mg via ORAL
  Filled 2023-12-08: qty 4

## 2023-12-08 MED ORDER — AZITHROMYCIN 250 MG PO TABS
500.0000 mg | ORAL_TABLET | Freq: Once | ORAL | Status: AC
  Administered 2023-12-08: 500 mg via ORAL
  Filled 2023-12-08: qty 2

## 2023-12-08 MED ORDER — AZITHROMYCIN 250 MG PO TABS
ORAL_TABLET | ORAL | 0 refills | Status: AC
  Filled 2023-12-08: qty 6, 5d supply, fill #0

## 2023-12-08 MED ORDER — CEFTRIAXONE SODIUM 1 G IJ SOLR
1.0000 g | Freq: Once | INTRAMUSCULAR | Status: AC
  Administered 2023-12-08: 1 g via INTRAVENOUS
  Filled 2023-12-08: qty 10

## 2023-12-08 NOTE — ED Provider Notes (Signed)
 [place] EMERGENCY DEPARTMENT AT [facility] Provider Note   CSN: [id number] Arrival date & time: 12/08/23  1306     Patient presents with: Chest Pain   Scott Mccarty is a 63 y.o. male history of hypercholesterolemia, diabetes here for evaluation of chest pain.  Started few hours PTA.  Described as a cramping and stabbing to his central lower chest, goes through to his back.  He has never had anything like this previously.  Did state that he felt something on to the upper portion of his stomach as well.  He is on Ozempic.  No history of pancreatitis.  No fever, nausea, vomiting, diarrhea, bloody stool, dysuria or hematuria.  He states he is typically able to complete his ADLs without any exertional chest pain or shortness of breath.  Saw cardiology about 4 years ago had a stress test which did not show any significant findings per patient.  No chronic NSAID use, EtOH use.  No recent upper respiratory symptoms cough, congestion, rhinorrhea.  No history of PE or DVT.  No recent surgery, Immobilization malignancy.  Initially pain was a 9 out of 10, now currently 1-2 out of 10. Has had a mild non productive cough. No hemoptysis.   HPI     Prior to Admission medications   Medication Sig Start Date End Date Taking? Authorizing Provider  azithromycin (ZITHROMAX) 250 MG tablet Take 2 tablets (500 mg total) by mouth daily for 1 day, THEN 1 tablet (250 mg total) daily for 4 days. Take first 2 tablets together, then 1 every day until finished. 12/08/23 12/13/23 Yes Leary Mcnulty A, PA-C  Albuterol  Sulfate (PROAIR  RESPICLICK) 108 (90 BASE) MCG/ACT AEPB Inhale 2 puffs into the lungs every 6 (six) hours as needed. 01/11/14   ClanceFrancis HERO, MD  atorvastatin (LIPITOR) 40 MG tablet Take 40 mg by mouth daily. 11/05/18   [provider]  gabapentin (NEURONTIN) 300 MG capsule Take 300 mg by mouth as needed. As Needed for knee pain    [provider]  SYMBICORT  160-4.5 MCG/ACT  inhaler INHALE 2 PUFFS INTO THE LUNGS 2 (TWO) TIMES DAILY. 09/05/13   Clance, Francis HERO, MD    Allergies: Celexa [citalopram] and Hydrocodone     Review of Systems  Constitutional: Negative.   HENT: Negative.    Respiratory:  Positive for cough and shortness of breath.   Cardiovascular:  Positive for chest pain. Negative for palpitations and leg swelling.  Gastrointestinal:  Positive for abdominal pain. Negative for abdominal distention, anal bleeding, blood in stool, constipation, diarrhea, nausea, rectal pain and vomiting.  Genitourinary: Negative.   Musculoskeletal: Negative.   Skin: Negative.   Neurological: Negative.   All other systems reviewed and are negative.   Updated Vital Signs BP 128/75   Pulse 92   Temp 98.4 F (36.9 C) (Oral)   Resp 19   SpO2 98%   Physical Exam Vitals and nursing note reviewed.  Constitutional:      General: He is not in acute distress.    Appearance: He is well-developed. He is not ill-appearing, toxic-appearing or diaphoretic.  HENT:     Head: Atraumatic.  Eyes:     Pupils: Pupils are equal, round, and reactive to light.  Cardiovascular:     Rate and Rhythm: Normal rate and regular rhythm.     Pulses:          Dorsalis pedis pulses are 2+ on the right side and 2+ on the left side.  Heart sounds: Normal heart sounds.  Pulmonary:     Effort: Pulmonary effort is normal. No respiratory distress.     Breath sounds: Normal breath sounds.     Comments: Clear bilaterally, speaks in full sentences without difficulty Chest:     Comments: Nontender chest wall.  No crepitus or step-off Abdominal:     General: Bowel sounds are normal. There is no distension.     Palpations: Abdomen is soft.     Comments: Soft, nontender, no rebound or guarding.  Musculoskeletal:        General: Normal range of motion.     Cervical back: Normal range of motion and neck supple.     Right lower leg: No tenderness. No edema.     Left lower leg: No tenderness. No  edema.     Comments: No bony tenderness, compartment soft, full range of motion.  No lower extremity edema  Skin:    General: Skin is warm and dry.     Capillary Refill: Capillary refill takes less than 2 seconds.     Comments: No obvious rashes or lesions to exposed skin  Neurological:     General: No focal deficit present.     Mental Status: He is alert and oriented to person, place, and time.    (all labs ordered are listed, but only abnormal results are displayed) Labs Reviewed  BASIC METABOLIC PANEL WITH GFR - Abnormal; Notable for the following components:      Result Value   Glucose, Bld 147 (*)    Calcium 10.4 (*)    All other components within normal limits  CBC - Abnormal; Notable for the following components:   WBC 13.6 (*)    All other components within normal limits  HEPATIC FUNCTION PANEL - Abnormal; Notable for the following components:   Bilirubin, Direct 0.3 (*)    All other components within normal limits  RESP PANEL BY RT-PCR (RSV, FLU A&B, COVID)  RVPGX2  LIPASE, BLOOD  TROPONIN T, HIGH SENSITIVITY  TROPONIN T, HIGH SENSITIVITY    EKG: None  Radiology: CT Angio Chest PE W and/or Wo Contrast Result Date: 12/08/2023 EXAM: CTA of the Chest without and with contrast for PE 12/08/2023 03:37:12 PM TECHNIQUE: CTA of the chest was performed without and with the administration of intravenous contrast (75 mL iohexol (OMNIPAQUE) 350 MG/ML injection). Multiplanar reformatted images are provided for review. MIP images are provided for review. Automated exposure control, iterative reconstruction, and/or weight based adjustment of the mA/kV was utilized to reduce the radiation dose to as low as reasonably achievable. COMPARISON: 10/01/2023 CLINICAL HISTORY: Pulmonary embolism (PE) suspected, high prob. Central chest pain which began this am and feels like cramping and little stabbing pain in my chest. Pt has some SOB with this. FINDINGS: PULMONARY ARTERIES: Pulmonary  arteries are adequately opacified for evaluation. No pulmonary embolism. Main pulmonary artery is normal in caliber. MEDIASTINUM: Coronary artery calcifications are noted. The heart and pericardium demonstrate no acute abnormality. There is no acute abnormality of the thoracic aorta. LYMPH NODES: No mediastinal, hilar or axillary lymphadenopathy. LUNGS AND PLEURA: Hypoinflation of the lungs with minimal bibasilar subsegmental atelectasis. Patchy airspace opacities are now noted in the right upper lobe most consistent with pneumonia. No pleural effusion or pneumothorax. UPPER ABDOMEN: Limited images of the upper abdomen are unremarkable. SOFT TISSUES AND BONES: No acute bone or soft tissue abnormality. IMPRESSION: 1. No evidence of pulmonary embolism. 2. Right upper lobe pneumonia. 3. Coronary artery calcifications. 4. Mild  bibasilar subsegmental atelectasis. Electronically signed by: Lynwood Seip MD 12/08/2023 03:51 PM EDT RP Workstation: [id number]   CT ABDOMEN PELVIS W CONTRAST Result Date: 12/08/2023 EXAM: CT ABDOMEN AND PELVIS WITH CONTRAST 12/08/2023 03:37:12 PM TECHNIQUE: CT of the abdomen and pelvis was performed with the administration of 75 mL of iohexol (OMNIPAQUE) 350 MG/ML injection. Multiplanar reformatted images are provided for review. Automated exposure control, iterative reconstruction, and/or weight-based adjustment of the mA/kV was utilized to reduce the radiation dose to as low as reasonably achievable. COMPARISON: CT abdomen and pelvis 12/24/2010. CLINICAL HISTORY: cp, abd pain. Triage note: Pt is here for central chest pain which began this am and feels like cramping and little stabbing pain in my chest. FINDINGS: LOWER CHEST: No acute abnormality. LIVER: The liver is unremarkable. GALLBLADDER AND BILE DUCTS: Gallbladder is unremarkable. No biliary ductal dilatation. SPLEEN: No acute abnormality. PANCREAS: No acute abnormality. ADRENAL GLANDS: No acute abnormality. KIDNEYS, URETERS AND  BLADDER: Bilateral renal cysts are again seen. The largest cyst is in the right kidney measuring 5 cm. Per consensus, no follow-up is needed for simple Bosniak type 1 and 2 renal cysts, unless the patient has a malignancy history or risk factors. Right lower lobe calculus measures 9 mm, new from prior. Calcified lesion in the lower pole of the right kidney measures 2.7 cm, unchanged. No stones in the left kidney or ureters. No hydronephrosis. No perinephric or periureteral stranding. Urinary bladder is unremarkable. GI AND BOWEL: Stomach demonstrates no acute abnormality. There are scattered colonic diverticula. New appendix appears normal. There is no bowel obstruction. PERITONEUM AND RETROPERITONEUM: No ascites. No free air. VASCULATURE: Aorta is normal in caliber. There is moderate calcified atherosclerotic disease throughout the aorta. LYMPH NODES: No lymphadenopathy. REPRODUCTIVE ORGANS: The prostate gland is enlarged. BONES AND SOFT TISSUES: No acute osseous abnormality. There are small fat-containing bilateral inguinal hernias. IMPRESSION: 1. No acute abdominal or pelvic abnormality identified. 2. Nonobstructing right renal calculus, 9 mm. 3. Calcified lesion in the lower pole of the right kidney measuring 2.7 cm, unchanged. 4. Colonic diverticulosis. 5. Enlarged prostate. 6. Moderate aortoiliac atherosclerosis. Electronically signed by: Greig Pique MD 12/08/2023 03:47 PM EDT RP Workstation: [id number]   DG Chest 2 View Result Date: 12/08/2023 CLINICAL DATA:  Central chest pain. EXAM: CHEST - 2 VIEW COMPARISON:  October 01, 2017 FINDINGS: The heart size and mediastinal contours are within normal limits. Mild atelectasis is seen within the left lung base. No acute infiltrate, pleural effusion or pneumothorax is identified. Postoperative changes are seen within the cervical spine. The visualized skeletal structures are otherwise unremarkable. IMPRESSION: No active cardiopulmonary disease. Electronically  Signed   By: Suzen Dials M.D.   On: 12/08/2023 14:12     Procedures   Medications Ordered in the ED  aspirin chewable tablet 324 mg (324 mg Oral Given 12/08/23 1412)  iohexol (OMNIPAQUE) 350 MG/ML injection 75 mL (75 mLs Intravenous Contrast Given 12/08/23 1529)  cefTRIAXone (ROCEPHIN) 1 g in sodium chloride  0.9 % 100 mL IVPB (0 g Intravenous Stopped 12/08/23 1713)  azithromycin (ZITHROMAX) tablet 500 mg (500 mg Oral Given 12/08/23 1375)   63 year old here for evaluation of chest pain which began a few hours PTA.  Radiates into her back.  Mild shortness of breath without nausea and vomiting.  Felt a odd sensation to his abdomen at same time.  No clinical evidence of VTE on exam.  No history of PE or DVT.  No PND orthopnea.  He is able to complete his ADLs typically  without exertional chest pain or shortness of breath.  Previously followed by cardiology about 4 years ago however not currently followed.  Pain here 1-2 out of 10.  Patient tachycardic, cannot PERC will plan on CTA. Mild cough. Plan on labs imaging, reassess.  Will give 324 aspirin  Labs and imaging personally viewed and interpreted:  CBC leukocytosis 13.6 Basic metabolic panel glucose 147 Hepatic function panel without significant finding Lipase 29 Trop <15 Chest x-ray without cardiomegaly, pulm edema, pneumothorax EKG without ischemic changes. CT AP wo acute abnormality CTA Chest Negative for PE but did show a right sided pneumonia  Patient reassessed.  We discussed labs and imaging.  He is pending delta troponin.  Discussed CT scan positive for right pneumonia.  He does state he has had a mild nonproductive cough.  Will plan on treating with Rocephin and azithromycin.  He has no tachypnea, hypoxia at this time.  Patient reassessed.  Discussed delta troponin flat.  He will be treated for his pneumonia.  At this time I have low suspicion for acute ACS, unstable angina, PE, dissection, pneumothorax, fluid overload,  Boerhaave rupture, endocarditis, pericarditis, myocarditis, pancreatitis, cholecystitis, symptomatic cholelithiasis, obstruction, perforation, mesenteric ischemia.  Will have him follow-up outpatient, return for any worsening symptoms.  The patient has been appropriately medically screened and/or stabilized in the ED. I have low suspicion for any other emergent medical condition which would require further screening, evaluation or treatment in the ED or require inpatient management.  Patient is hemodynamically stable and in no acute distress.  Patient able to ambulate in department prior to ED.  Evaluation does not show acute pathology that would require ongoing or additional emergent interventions while in the emergency department or further inpatient treatment.  I have discussed the diagnosis with the patient and answered all questions.  Pain is been managed while in the emergency department and patient has no further complaints prior to discharge.  Patient is comfortable with plan discussed in room and is stable for discharge at this time.  I have discussed strict return precautions for returning to the emergency department.  Patient was encouraged to follow-up with PCP/specialist refer to at discharge.                                    Medical Decision Making Amount and/or Complexity of Data Reviewed External Data Reviewed: labs, radiology, ECG and notes. Labs: ordered. Decision-making details documented in ED Course. Radiology: ordered and independent interpretation performed. Decision-making details documented in ED Course. ECG/medicine tests: ordered and independent interpretation performed. Decision-making details documented in ED Course.  Risk OTC drugs. Prescription drug management. Parenteral controlled substances. Decision regarding hospitalization. Diagnosis or treatment significantly limited by social determinants of health.        Final diagnoses:  Precordial pain   Pneumonia of right upper lobe due to infectious organism    ED Discharge Orders          Ordered    azithromycin (ZITHROMAX) 250 MG tablet  Daily        12/08/23 1706               Evita Merida A, PA-C 12/08/23 1729    Jerrol Agent, MD 12/10/23 1645

## 2023-12-08 NOTE — ED Triage Notes (Signed)
 Pt is here for central chest pain which began this am and feels like cramping and little stabbing pain in my chest.  Pt has some sob with this.

## 2023-12-08 NOTE — Discharge Instructions (Signed)
 It was a pleasure taking care of you here today.  Your CT scan showed a pneumonia.  We have started you on antibiotics.  Take as prescribed.  Your heart enzymes are reassuring.  Make sure to follow-up with your primary care provider or cardiologist.  Return for any new or worsening symptoms.

## 2023-12-10 ENCOUNTER — Encounter: Payer: Self-pay | Admitting: Physician Assistant

## 2023-12-10 ENCOUNTER — Ambulatory Visit: Attending: Physician Assistant | Admitting: Physician Assistant

## 2023-12-10 VITALS — BP 122/74 | HR 103 | Ht 69.0 in | Wt 197.4 lb

## 2023-12-10 DIAGNOSIS — I471 Supraventricular tachycardia, unspecified: Secondary | ICD-10-CM | POA: Diagnosis not present

## 2023-12-10 DIAGNOSIS — R079 Chest pain, unspecified: Secondary | ICD-10-CM | POA: Diagnosis not present

## 2023-12-10 DIAGNOSIS — E785 Hyperlipidemia, unspecified: Secondary | ICD-10-CM | POA: Diagnosis not present

## 2023-12-10 NOTE — Progress Notes (Unsigned)
 Cardiology Office Note   Date:  12/12/2023  ID:  Scott Mccarty, DOB [birth year], MRN [id number] PCP: Scott Mccarty  [facility] Providers Cardiologist:  Scott Lesches, MD     History of Present Illness Scott Mccarty is a 63 y.o. male with past medical history of former smoker, OSA and hyperlipidemia.  He used to see Dr. Maye in the past.  He had a normal echocardiogram and a Myoview  in 2012.  Father had a stent in his 74s, however patient himself never had a heart attack or stroke.  Echocardiogram obtained on 11/11/2017 showed EF 50 to 55% with mild LVH.  Last Myoview  stress test obtained on 11/10/2017 was normal without ischemia.  Patient was last seen by Dr. Dorn Mccarty in December 2019, he was not using his CPAP machine at the time.  It was noted patient has developed a new Q wave in the inferior leads on EKG despite having no ischemia or infarction on the Myoview .  He has been lost to follow-up since.  He had repeat echocardiogram and Myoview  in November 2022.  Echocardiogram at the time showed EF 60 to 65%, no regional wall motion abnormality, RVSP 28.2 mmHg.  Myoview  was low risk and the patient was able to achieve 7 METS of activity, no ischemia or evidence of infarction, EF 59%.  Heart monitor in November 2022 showed minimum heart rate 55, maximal heart rate of 193, average heart rate 86, 437 episode of SVT, longest lasting 58.1 seconds with average rate of 109 bpm.  Patient presents today for follow-up.  He has been having intermittent chest discomfort for several months now, he is under increased stress.  Chest discomfort seems to correlate with the degree of emotional stress but not with physical exertion.  Symptoms would last for few minutes before resolving.  He had a very intense chest discomfort 2 days ago that prompted him to seek urgent medical attention in the emergency room.  Serial troponin was negative x 2.  White blood cell count elevated at 13.6.  With  normal renal function and electrolyte.  Viral panel negative for COVID, flu or RSV.  Chest x-ray was normal.  Abdominal CT showed 9 mm nonobstructing right renal calculus, calcified lesion in the lower pole of the right kidney measuring 2.7 cm, colonic diverticulosis, moderate aortoiliac atherosclerosis, no acute abdominal or pelvic abnormality identified.  CTA of the chest was negative for PE however shows right upper lobe pneumonia.  She was given Rocephin followed by azithromycin for walking pneumonia.  Surprisingly, he denies any fever, chill or cough.  He presents today for follow-up, he says his chest tightness has improved.  His heart rate remain borderline elevated.  EKG continued to show sinus rhythm with Q waves in the inferior lead, this Q wave was already present on the previous EKG from 2019.  Since his chest discomfort is improving however heart rate is still borderline elevated, I recommend that he finish the remaining prescription for the antibiotic.  I plan to bring the patient back in 4 weeks for reassessment, if chest pain still may intermittent at that time, I will order a coronary CTA for more definitive evaluation of coronary artery disease.  Patient says he has early family history of CAD.  He is no longer taking Lipitor due to myalgia.  He previously also tried Crestor and could not tolerate it due to myalgia either.  ROS:   Patient complains of intermittent chest discomfort.  Studies Reviewed EKG Interpretation Date/Time:  Thursday December 10 2023 14:40:15 EDT Ventricular Rate:  88 PR Interval:  150 QRS Duration:  88 QT Interval:  348 QTC Calculation: 421 R Axis:   19  Text Interpretation: Normal sinus rhythm Inferior infarct (cited on or before 08-Dec-2023) When compared with ECG of 08-Dec-2023 13:12, Inverted T waves have replaced nonspecific T wave abnormality in Inferior leads Confirmed by Scott Mccarty [phone]) on 12/10/2023 3:12:24 PM    Cardiac Studies & Procedures    ______________________________________________________________________________________________   STRESS TESTS  MYOCARDIAL PERFUSION IMAGING 01/23/2021  Interpretation Summary   The study is normal. The study is low risk.   Patient exercised according to the BRUCE protocol for achieving 7.0 METs. Target HR was achieved (141bpm; 89% MPHR)   No ST deviation was noted.   LV perfusion is normal. There is no evidence of ischemia. There is no evidence of infarction.   Left ventricular function is normal. Nuclear stress EF: 59 %. The left ventricular ejection fraction is normal (55-65%). End diastolic cavity size is normal.   Prior study available for comparison from 11/10/2017. Compared to prior study, LVEF on current study is normal at 59%   ECHOCARDIOGRAM  ECHOCARDIOGRAM COMPLETE 01/23/2021  Narrative ECHOCARDIOGRAM REPORT    Patient Name:   Scott Mccarty   Date of Exam: 01/23/2021 Medical Rec #:  [id number]     Height:       69.0 in Accession #:    [id number]    Weight:       204.0 lb Date of Birth:  Oct 30, 1960     BSA:          2.083 m Patient Age:    60 years      BP:           114/68 mmHg Patient Gender: M             HR:           80 bpm. Exam Location:  Scott Mccarty  Procedure: 2D Echo and 3D Echo  Indications:    R07.9 Chest pain R00.2 Palpitations  History:        Patient has prior history of Echocardiogram examinations, most recent 11/11/2017. Risk Factors:Dyslipidemia and Diabetes. Chest Pain. Obstructive sleep apnea. PVC's. Shortnes of breath.  Sonographer:    Scott Mccarty Scott Mccarty, RDCS Referring Phys: [id number] Scott Mccarty Scott Mccarty  IMPRESSIONS   1. Left ventricular ejection fraction, by estimation, is 60 to 65%. Left ventricular ejection fraction by 3D volume is 60 %. The left ventricle has normal function. The left ventricle has no regional wall motion abnormalities. Left ventricular diastolic parameters were normal. The average left ventricular global  longitudinal strain is -18.0 %. 2. Right ventricular systolic function is normal. The right ventricular size is normal. There is normal pulmonary artery systolic pressure. The estimated right ventricular systolic pressure is 28.2 mmHg. 3. The mitral valve is normal in structure. No evidence of mitral valve regurgitation. No evidence of mitral stenosis. 4. The aortic valve is tricuspid. Aortic valve regurgitation is not visualized. Aortic valve sclerosis is present, with no evidence of aortic valve stenosis. 5. The inferior vena cava is dilated in size with >50% respiratory variability, suggesting right atrial pressure of 8 mmHg.  FINDINGS Left Ventricle: Left ventricular ejection fraction, by estimation, is 60 to 65%. Left ventricular ejection fraction by 3D volume is 60 %. The left ventricle has normal function. The left ventricle has no regional wall motion abnormalities. The average left ventricular global longitudinal strain is -18.0 %.  The left ventricular internal cavity size was normal in size. There is no left ventricular hypertrophy. Left ventricular diastolic parameters were normal.  Right Ventricle: The right ventricular size is normal. No increase in right ventricular wall thickness. Right ventricular systolic function is normal. There is normal pulmonary artery systolic pressure. The tricuspid regurgitant velocity is 2.25 m/s, and with an assumed right atrial pressure of 8 mmHg, the estimated right ventricular systolic pressure is 28.2 mmHg.  Left Atrium: Left atrial size was normal in size.  Right Atrium: Right atrial size was normal in size.  Pericardium: There is no evidence of pericardial effusion.  Mitral Valve: The mitral valve is normal in structure. No evidence of mitral valve regurgitation. No evidence of mitral valve stenosis.  Tricuspid Valve: The tricuspid valve is normal in structure. Tricuspid valve regurgitation is trivial.  Aortic Valve: The aortic valve is  tricuspid. Aortic valve regurgitation is not visualized. Aortic valve sclerosis is present, with no evidence of aortic valve stenosis.  Pulmonic Valve: The pulmonic valve was not well visualized. Pulmonic valve regurgitation is trivial.  Aorta: The aortic root and ascending aorta are structurally normal, with no evidence of dilitation.  Venous: The inferior vena cava is dilated in size with greater than 50% respiratory variability, suggesting right atrial pressure of 8 mmHg.  IAS/Shunts: No atrial level shunt detected by color flow Doppler.   LEFT VENTRICLE PLAX 2D LVIDd:         4.50 cm         Diastology LVIDs:         2.60 cm         LV e' medial:    7.72 cm/s LV PW:         1.10 cm         LV E/e' medial:  9.1 LV IVS:        0.70 cm         LV e' lateral:   11.60 cm/s LVOT diam:     2.10 cm         LV E/e' lateral: 6.1 LV SV:         73 LV SV Index:   35              2D LVOT Area:     3.46 cm        Longitudinal Strain 2D Strain GLS  -17.4 % (A2C): 2D Strain GLS  -19.7 % (A3C): 2D Strain GLS  -17.1 % (A4C): 2D Strain GLS  -18.0 % Avg:  3D Volume EF LV 3D EF:    Left ventricul ar ejection fraction by 3D volume is 60 %.  3D Volume EF: 3D EF:        60 % LV EDV:       123 ml LV ESV:       49 ml LV SV:        73 ml  RIGHT VENTRICLE             IVC RV Basal diam:  3.50 cm     IVC diam: 2.10 cm RV Mid diam:    3.30 cm RV S prime:     12.40 cm/s TAPSE (M-mode): 2.2 cm  LEFT ATRIUM             Index        RIGHT ATRIUM           Index LA diam:        3.80 cm  1.82 cm/m   RA Area:     15.70 cm LA Vol (A2C):   28.8 ml 13.82 ml/m  RA Volume:   39.50 ml  18.96 ml/m LA Vol (A4C):   13.5 ml 6.48 ml/m LA Biplane Vol: 19.9 ml 9.55 ml/m AORTIC VALVE LVOT Vmax:   112.00 cm/s LVOT Vmean:  76.400 cm/s LVOT VTI:    0.212 m  AORTA Ao Root diam: 3.40 cm Ao Asc diam:  3.50 cm  MITRAL VALVE               TRICUSPID VALVE MV Area (PHT): 3.19 cm    TR Peak grad:    20.2 mmHg MV Decel Time: 238 msec    TR Vmax:        225.00 cm/s MV E velocity: 70.20 cm/s MV A velocity: 88.60 cm/s  SHUNTS MV E/A ratio:  0.79        Systemic VTI:  0.21 m Systemic Diam: 2.10 cm  Lonni Nanas MD Electronically signed by Lonni Nanas MD Signature Date/Time: 01/23/2021/12:40:49 PM    Final    MONITORS  LONG TERM MONITOR (3-14 DAYS) 01/22/2021  Narrative Patch Wear Time:  14 days and 0 hours (2022-11-06T08:22:01-499 to 2022-11-20T08:22:16-0500)  Patient had a min HR of 55 bpm, max HR of 193 bpm, and avg HR of 86 bpm. Predominant underlying rhythm was Sinus Rhythm. Slight P wave morphology changes were noted. 437 Supraventricular Tachycardia runs occurred, the run with the fastest interval lasting 5 beats with a max rate of 193 bpm, the longest lasting 58.1 secs with an avg rate of 109 bpm. Isolated SVEs were occasional (1.1%, 18657), SVE Couplets were rare (<1.0%, 2551), and SVE Triplets were rare (<1.0%, 868). Some episodes of Supraventricular Tachycardia may be possible Atrial Tachycardia with variable block. Isolated VEs were rare (<1.0%, 96), VE Couplets were rare (<1.0%, 2), and VE Triplets were rare (<1.0%, 1).  1. SR/SB/ST 2. Freq PACs, occasional PVCs 3. Short runs of SVT       ______________________________________________________________________________________________      Risk Assessment/Calculations          Physical Exam VS:  BP 122/74   Pulse (!) 103   Ht 5' 9 (1.753 m)   Wt 197 lb 6.4 oz (89.5 kg)   SpO2 96%   BMI 29.15 kg/m        Wt Readings from Last 3 Encounters:  12/10/23 197 lb 6.4 oz (89.5 kg)  02/27/21 201 lb 6.4 oz (91.4 kg)  01/23/21 204 lb (92.5 kg)    GEN: Well nourished, well developed in no acute distress NECK: No JVD; No carotid bruits CARDIAC: RRR, no murmurs, rubs, gallops RESPIRATORY:  Clear to auscultation without rales, wheezing or rhonchi  ABDOMEN: Soft, non-tender,  non-distended EXTREMITIES:  No edema; No deformity   ASSESSMENT AND PLAN  Chest discomfort: Symptom seems to be more associated with emotional stress rather than exertional stress.  He was seen in the ED 2 days ago and was diagnosed with a right upper lobe pneumonia.  Troponin was negative.  He is currently finishing a course of antibiotic.  I plan to reassess the patient in 4 weeks, if he is still having intermittent chest discomfort, we will consider coronary CTA.  He is aware to seek urgent medical attention if symptom worsens.  History of SVT: Previous heart monitor showed frequent SVT.  No prolonged episode.  Hyperlipidemia: Intolerant of both atorvastatin and rosuvastatin.       Dispo: Plan to follow-up  in 4 weeks to reassess chest discomfort.  Signed, Scot Ford, PA

## 2023-12-10 NOTE — Patient Instructions (Signed)
 Thank you for choosing [facility]!     Medication Instructions:  START Aspirin 81mg . Take this tablet daily. This medication can be purchased at your local drug store.  *If you need a refill on your cardiac medications before your next appointment, please call your pharmacy*   Lab Work: No labs were ordered during today's visit.  If you have labs (blood work) drawn today and your tests are completely normal, you will receive your results only by: MyChart Message (if you have MyChart) OR A paper copy in the mail If you have any lab test that is abnormal or we need to change your treatment, we will call you to review the results.   Testing/Procedures: No procedures were ordered during today's visit.   Your next appointment:   1 month(s)   Provider:   Scot Ford, PA-C           Follow-Up: At [facility], you and your health needs are our priority.  As part of our continuing mission to provide you with exceptional heart care, we have created designated Provider Care Teams.  These Care Teams include your primary Cardiologist (physician) and Advanced Practice Providers (APPs -  Physician Assistants and Nurse Practitioners) who all work together to provide you with the care you need, when you need it. We recommend signing up for the patient portal called MyChart.  Sign up information is provided on this After Visit Summary.  MyChart is used to connect with patients for Virtual Visits (Telemedicine).  Patients are able to view lab/test results, encounter notes, upcoming appointments, etc.  Non-urgent messages can be sent to your provider as well.   To learn more about what you can do with MyChart, go to [email or web address].

## 2024-01-12 ENCOUNTER — Ambulatory Visit: Attending: Physician Assistant | Admitting: Physician Assistant

## 2024-01-12 VITALS — BP 130/68 | HR 90 | Ht 69.0 in | Wt 194.0 lb

## 2024-01-12 DIAGNOSIS — E785 Hyperlipidemia, unspecified: Secondary | ICD-10-CM | POA: Diagnosis not present

## 2024-01-12 DIAGNOSIS — I471 Supraventricular tachycardia, unspecified: Secondary | ICD-10-CM | POA: Diagnosis not present

## 2024-01-12 DIAGNOSIS — R079 Chest pain, unspecified: Secondary | ICD-10-CM | POA: Diagnosis not present

## 2024-01-12 MED ORDER — METOPROLOL TARTRATE 100 MG PO TABS: 100.0000 mg | ORAL_TABLET | Freq: Once | ORAL | 0 refills | Status: AC

## 2024-01-12 NOTE — Progress Notes (Signed)
 Cardiology Office Note   Date:  01/12/2024  ID:  Scott Mccarty, DOB May 17, 1960, MRN [id number] PCP: [name]  [facility] Providers Cardiologist:  Dorn Lesches, MD     History of Present Illness Scott Mccarty is a 63 y.o. male with past medical history of former smoker, OSA and hyperlipidemia.  He used to see Dr. Maye in the past.  He had a normal echocardiogram and a Myoview  in 2012.  Father had a stent in his 40s, however patient himself never had a heart attack or stroke.  Echocardiogram obtained on 11/11/2017 showed EF 50 to 55% with mild LVH.  Last Myoview  stress test obtained on 11/10/2017 was normal without ischemia.  Patient was seen by Dr. Dorn Lesches in December 2019, he was not using his CPAP machine at the time.  It was noted patient has developed a new Q wave in the inferior leads on EKG despite having no ischemia or infarction on the Myoview .  He has been lost to follow-up since.  He had repeat echocardiogram and Myoview  in November 2022.  Echocardiogram at the time showed EF 60 to 65%, no regional wall motion abnormality, RVSP 28.2 mmHg.  Myoview  was low risk and patient was able to achieve 7 METS of activity, no ischemia or evidence of infarction, EF 59%.  Heart monitor in November 2022 showed minimum heart rate 55, maximal heart rate of 193, average heart rate 86, 437 episode of SVT, longest lasting 58.1 seconds with average rate of 109 bpm.   I last saw the patient on 12/10/2023 at which time he complained of intermittent chest discomfort for several months.  He had a episode of very intense chest discomfort 2 days before he saw me and sought medical attention in the emergency room.  Serial troponin was negative x 2.  White blood cell count was elevated.  Viral panel negative.  Abdominal CT showed 9 mm nonobstructing right renal calculus, calcified lesion in the lower pole of the right kidney measuring 2.7 cm, colonic diverticulosis, moderate aortoiliac  atherosclerosis, no acute abdominal or pelvic abnormality identified.  CTA of the chest was negative for PE but showed a right upper lobe pneumonia.  Patient was treated with Rocephin followed by azithromycin for walking pneumonia.  We discussed various options for his chest discomfort, I recommended wait until he finished the antibiotic to see if treating pneumonia would help with his chest pain before we consider coronary CTA.  He could not tolerate atorvastatin due to myalgia.  He also previously tried rosuvastatin and could not tolerate either due to the same side effect.  Patient presents today for follow-up, his chest discomfort has improved.  CT image does show coronary atherosclerosis.  He has significant family history of CAD.  We will order a coronary CTA to make sure he does not have any obstructive disease.  He will need a single dose of 100 mg metoprolol tartrate 2 hours prior to the coronary CTA to slow down the heart rate.  If there is coronary artery disease, I will also refer the patient to lipid clinic for consideration of Repatha or Praluent.  As long as the coronary CTA does not show significant obstructive disease, patient can follow-up with Dr. Lesches in 6 months  ROS:   Previous chest discomfort is improving.  He has no lower extremity edema, orthopnea or PND  Studies Reviewed      Cardiac Studies & Procedures   ______________________________________________________________________________________________   STRESS TESTS  MYOCARDIAL PERFUSION  IMAGING 01/23/2021  Interpretation Summary   The study is normal. The study is low risk.   Patient exercised according to the BRUCE protocol for achieving 7.0 METs. Target HR was achieved (141bpm; 89% MPHR)   No ST deviation was noted.   LV perfusion is normal. There is no evidence of ischemia. There is no evidence of infarction.   Left ventricular function is normal. Nuclear stress EF: 59 %. The left ventricular ejection  fraction is normal (55-65%). End diastolic cavity size is normal.   Prior study available for comparison from 11/10/2017. Compared to prior study, LVEF on current study is normal at 59%   ECHOCARDIOGRAM  ECHOCARDIOGRAM COMPLETE 01/23/2021  Narrative ECHOCARDIOGRAM REPORT    Patient Name:   Scott Mccarty   Date of Exam: 01/23/2021 Medical Rec #:  [id number]     Height:       69.0 in Accession #:    [id number]    Weight:       204.0 lb Date of Birth:  Feb 01, 1961     BSA:          2.083 m Patient Age:    60 years      BP:           114/68 mmHg Patient Gender: M             HR:           80 bpm. Exam Location:  [address]  Procedure: 2D Echo and 3D Echo  Indications:    R07.9 Chest pain R00.2 Palpitations  History:        Patient has prior history of Echocardiogram examinations, most recent 11/11/2017. Risk Factors:Dyslipidemia and Diabetes. Chest Pain. Obstructive sleep apnea. PVC's. Shortnes of breath.  Sonographer:    Nolon Berg [facility], RDCS Referring Phys: [id number] DELON POUR [facility]  IMPRESSIONS   1. Left ventricular ejection fraction, by estimation, is 60 to 65%. Left ventricular ejection fraction by 3D volume is 60 %. The left ventricle has normal function. The left ventricle has no regional wall motion abnormalities. Left ventricular diastolic parameters were normal. The average left ventricular global longitudinal strain is -18.0 %. 2. Right ventricular systolic function is normal. The right ventricular size is normal. There is normal pulmonary artery systolic pressure. The estimated right ventricular systolic pressure is 28.2 mmHg. 3. The mitral valve is normal in structure. No evidence of mitral valve regurgitation. No evidence of mitral stenosis. 4. The aortic valve is tricuspid. Aortic valve regurgitation is not visualized. Aortic valve sclerosis is present, with no evidence of aortic valve stenosis. 5. The inferior vena cava is dilated in size with >50% respiratory  variability, suggesting right atrial pressure of 8 mmHg.  FINDINGS Left Ventricle: Left ventricular ejection fraction, by estimation, is 60 to 65%. Left ventricular ejection fraction by 3D volume is 60 %. The left ventricle has normal function. The left ventricle has no regional wall motion abnormalities. The average left ventricular global longitudinal strain is -18.0 %. The left ventricular internal cavity size was normal in size. There is no left ventricular hypertrophy. Left ventricular diastolic parameters were normal.  Right Ventricle: The right ventricular size is normal. No increase in right ventricular wall thickness. Right ventricular systolic function is normal. There is normal pulmonary artery systolic pressure. The tricuspid regurgitant velocity is 2.25 m/s, and with an assumed right atrial pressure of 8 mmHg, the estimated right ventricular systolic pressure is 28.2 mmHg.  Left Atrium: Left atrial size was normal in size.  Right Atrium: Right atrial size was normal in size.  Pericardium: There is no evidence of pericardial effusion.  Mitral Valve: The mitral valve is normal in structure. No evidence of mitral valve regurgitation. No evidence of mitral valve stenosis.  Tricuspid Valve: The tricuspid valve is normal in structure. Tricuspid valve regurgitation is trivial.  Aortic Valve: The aortic valve is tricuspid. Aortic valve regurgitation is not visualized. Aortic valve sclerosis is present, with no evidence of aortic valve stenosis.  Pulmonic Valve: The pulmonic valve was not well visualized. Pulmonic valve regurgitation is trivial.  Aorta: The aortic root and ascending aorta are structurally normal, with no evidence of dilitation.  Venous: The inferior vena cava is dilated in size with greater than 50% respiratory variability, suggesting right atrial pressure of 8 mmHg.  IAS/Shunts: No atrial level shunt detected by color flow Doppler.   LEFT VENTRICLE PLAX 2D LVIDd:          4.50 cm         Diastology LVIDs:         2.60 cm         LV e' medial:    7.72 cm/s LV PW:         1.10 cm         LV E/e' medial:  9.1 LV IVS:        0.70 cm         LV e' lateral:   11.60 cm/s LVOT diam:     2.10 cm         LV E/e' lateral: 6.1 LV SV:         73 LV SV Index:   35              2D LVOT Area:     3.46 cm        Longitudinal Strain 2D Strain GLS  -17.4 % (A2C): 2D Strain GLS  -19.7 % (A3C): 2D Strain GLS  -17.1 % (A4C): 2D Strain GLS  -18.0 % Avg:  3D Volume EF LV 3D EF:    Left ventricul ar ejection fraction by 3D volume is 60 %.  3D Volume EF: 3D EF:        60 % LV EDV:       123 ml LV ESV:       49 ml LV SV:        73 ml  RIGHT VENTRICLE             IVC RV Basal diam:  3.50 cm     IVC diam: 2.10 cm RV Mid diam:    3.30 cm RV S prime:     12.40 cm/s TAPSE (M-mode): 2.2 cm  LEFT ATRIUM             Index        RIGHT ATRIUM           Index LA diam:        3.80 cm 1.82 cm/m   RA Area:     15.70 cm LA Vol (A2C):   28.8 ml 13.82 ml/m  RA Volume:   39.50 ml  18.96 ml/m LA Vol (A4C):   13.5 ml 6.48 ml/m LA Biplane Vol: 19.9 ml 9.55 ml/m AORTIC VALVE LVOT Vmax:   112.00 cm/s LVOT Vmean:  76.400 cm/s LVOT VTI:    0.212 m  AORTA Ao Root diam: 3.40 cm Ao Asc diam:  3.50 cm  MITRAL VALVE  TRICUSPID VALVE MV Area (PHT): 3.19 cm    TR Peak grad:   20.2 mmHg MV Decel Time: 238 msec    TR Vmax:        225.00 cm/s MV E velocity: 70.20 cm/s MV A velocity: 88.60 cm/s  SHUNTS MV E/A ratio:  0.79        Systemic VTI:  0.21 m Systemic Diam: 2.10 cm  Lonni Nanas MD Electronically signed by Lonni Nanas MD Signature Date/Time: 01/23/2021/12:40:49 PM    Final    MONITORS  LONG TERM MONITOR (3-14 DAYS) 01/22/2021  Narrative Patch Wear Time:  14 days and 0 hours (2022-11-06T08:22:01-499 to 2022-11-20T08:22:16-0500)  Patient had a min HR of 55 bpm, max HR of 193 bpm, and avg HR of 86 bpm. Predominant  underlying rhythm was Sinus Rhythm. Slight P wave morphology changes were noted. 437 Supraventricular Tachycardia runs occurred, the run with the fastest interval lasting 5 beats with a max rate of 193 bpm, the longest lasting 58.1 secs with an avg rate of 109 bpm. Isolated SVEs were occasional (1.1%, 18657), SVE Couplets were rare (<1.0%, 2551), and SVE Triplets were rare (<1.0%, 868). Some episodes of Supraventricular Tachycardia may be possible Atrial Tachycardia with variable block. Isolated VEs were rare (<1.0%, 96), VE Couplets were rare (<1.0%, 2), and VE Triplets were rare (<1.0%, 1).  1. SR/SB/ST 2. Freq PACs, occasional PVCs 3. Short runs of SVT       ______________________________________________________________________________________________      Risk Assessment/Calculations           Physical Exam VS:  BP 130/68   Pulse 90   Ht 5' 9 (1.753 m)   Wt 194 lb (88 kg)   SpO2 97%   BMI 28.65 kg/m        Wt Readings from Last 3 Encounters:  01/12/24 194 lb (88 kg)  12/10/23 197 lb 6.4 oz (89.5 kg)  02/27/21 201 lb 6.4 oz (91.4 kg)    GEN: Well nourished, well developed in no acute distress NECK: No JVD; No carotid bruits CARDIAC: RRR, no murmurs, rubs, gallops RESPIRATORY:  Clear to auscultation without rales, wheezing or rhonchi  ABDOMEN: Soft, non-tender, non-distended EXTREMITIES:  No edema; No deformity   ASSESSMENT AND PLAN  Precordial chest discomfort: Significant family history of CAD.  Previously had chest discomfort for several months, improved when compared to the last visit.  He has history of chronic Q wave in the inferior lead despite having normal Myoview .  This is unchanged on today's EKG.  I will obtain a coronary CTA to further assess.  Hyperlipidemia: If he does have coronary artery disease, then we will need to consider referred the patient to lipid clinic to start on Repatha or Praluent.  He is intolerant of atorvastatin and  rosuvastatin  History of SVT: Seen on previous heart monitor, transient.       Dispo: Follow-up with Dr. Opal in 6 months unless coronary CTA come back abnormal.  Signed, Scot Ford, PA

## 2024-01-12 NOTE — Patient Instructions (Addendum)
 Medication Instructions:    ONE TIME ONLY : TAKE METOPROLOL 100 MG  2 HOURS PRIOR TO SCHEDULED PROCEDURE    *If you need a refill on your cardiac medications before your next appointment, please call your pharmacy*   Lab Work: NONE ORDERED  TODAY    If you have labs (blood work) drawn today and your tests are completely normal, you will receive your results only by: MyChart Message (if you have MyChart) OR A paper copy in the mail If you have any lab test that is abnormal or we need to change your treatment, we will call you to review the results.  Testing/Procedures: Non-Cardiac CT Angiography (CTA), is a special type of CT scan that uses a computer to produce multi-dimensional views of major blood vessels throughout the body. In CT angiography, a contrast material is injected through an IV to help visualize the blood vessels     Follow-Up: At [facility], you and your health needs are our priority.  As part of our continuing mission to provide you with exceptional heart care, our providers are all part of one team.  This team includes your primary Cardiologist (physician) and Advanced Practice Providers or APPs (Physician Assistants and Nurse Practitioners) who all work together to provide you with the care you need, when you need it.  Your next appointment:    6 month(s)  Provider:    Dorn Lesches, MD    We recommend signing up for the patient portal called MyChart.  Sign up information is provided on this After Visit Summary.  MyChart is used to connect with patients for Virtual Visits (Telemedicine).  Patients are able to view lab/test results, encounter notes, upcoming appointments, etc.  Non-urgent messages can be sent to your provider as well.   To learn more about what you can do with MyChart, go to [email or web address].   Other Instructions    Your cardiac CT will be scheduled at one of the below locations:   [facility] [address] [place], [place] [address] [phone] (Severe contrast allergies only)  OR   [facility] [address] [place], [place] [address] [phone]  OR   [facility] [address] [place], [place] [address] [phone]  OR   [facility] [address]  [place], [place] [address]  OR   [facility] [address] [place], [place] [phone]  If scheduled at [facility], please arrive at the [facility] and Children's Entrance (Entrance C2) of [facility] 30 minutes prior to test start time. You can use the FREE valet parking offered at entrance C (encouraged to control the heart rate for the test)  Proceed to the [facility] Radiology Department (first floor) to check-in and test prep.  All radiology patients and guests should use entrance C2 at [facility], accessed from [facility], even though the hospital's physical address listed is [address]  If scheduled at the Heart and Vascular Tower at [address], please enter the parking lot using the [address] entrance and use the FREE valet service at the patient drop-off area. Enter the building and check-in with registration on the main floor.  If scheduled at [facility], please arrive to the Heart and Vascular Center 15 mins early for check-in and test prep.  There is spacious parking and easy access to the radiology department from the [facility]  Heart and Vascular entrance. Please enter here and check-in with the desk attendant.   If scheduled at [facility], please arrive 30 minutes early for check-in and test prep.  Please follow these instructions carefully (unless otherwise directed):  An IV will be required for this test and Nitroglycerin will be given.   On the Night Before the Test: Be sure to Drink plenty of water. Do not consume any  caffeinated/decaffeinated beverages or chocolate 12 hours prior to your test. Do not take any antihistamines 12 hours prior to your test. On the Day of the Test: Drink plenty of water until 1 hour prior to the test. Do not eat any food 1 hour prior to test. You may take your regular medications prior to the test.  Take metoprolol (Lopressor) two hours prior to test. If you take Furosemide/Hydrochlorothiazide/Spironolactone/Chlorthalidone, please HOLD on the morning of the test. Patients who wear a continuous glucose monitor MUST remove the device prior to scanning. FEMALES- please wear underwire-free bra if available, avoid dresses & tight clothing         After the Test: Drink plenty of water. After receiving IV contrast, you may experience a mild flushed feeling. This is normal. On occasion, you may experience a mild rash up to 24 hours after the test. This is not dangerous. If this occurs, you can take Benadryl 25 mg, Zyrtec, Claritin, or Allegra and increase your fluid intake. (Patients taking Tikosyn should avoid Benadryl, and may take Zyrtec, Claritin, or Allegra) If you experience trouble breathing, this can be serious. If it is severe call 911 IMMEDIATELY. If it is mild, please call our office.  We will call to schedule your test 2-4 weeks out understanding that some insurance companies will need an authorization prior to the service being performed.   For more information and frequently asked questions, please visit our website : [email or web address]  For non-scheduling related questions, please contact the cardiac imaging nurse navigator should you have any questions/concerns: Cardiac Imaging Nurse Navigators Direct Office Dial: [phone]   For scheduling needs, including cancellations and rescheduling, please call Brittany, [phone].

## 2024-02-02 ENCOUNTER — Encounter (HOSPITAL_COMMUNITY): Payer: Self-pay

## 2024-02-04 ENCOUNTER — Ambulatory Visit (HOSPITAL_COMMUNITY)
Admission: RE | Admit: 2024-02-04 | Discharge: 2024-02-04 | Disposition: A | Discharge status: Home / Self Care | Source: Ambulatory Visit | Attending: Internal Medicine | Admitting: Internal Medicine

## 2024-02-04 DIAGNOSIS — I2510 Atherosclerotic heart disease of native coronary artery without angina pectoris: Secondary | ICD-10-CM | POA: Diagnosis not present

## 2024-02-04 DIAGNOSIS — R918 Other nonspecific abnormal finding of lung field: Secondary | ICD-10-CM | POA: Insufficient documentation

## 2024-02-04 DIAGNOSIS — R079 Chest pain, unspecified: Secondary | ICD-10-CM | POA: Insufficient documentation

## 2024-02-04 LAB — POCT I-STAT CREATININE: Creatinine, Ser: 0.9 mg/dL (ref 0.61–1.24)

## 2024-02-04 MED ORDER — NITROGLYCERIN 0.4 MG SL SUBL
0.8000 mg | SUBLINGUAL_TABLET | Freq: Once | SUBLINGUAL | Status: AC
  Administered 2024-02-04: 0.8 mg via SUBLINGUAL

## 2024-02-04 MED ORDER — METOPROLOL TARTRATE 5 MG/5ML IV SOLN
10.0000 mg | Freq: Once | INTRAVENOUS | Status: AC | PRN
  Administered 2024-02-04: 10 mg via INTRAVENOUS

## 2024-02-04 MED ORDER — IOHEXOL 350 MG/ML SOLN
100.0000 mL | Freq: Once | INTRAVENOUS | Status: AC | PRN
  Administered 2024-02-04: 100 mL via INTRAVENOUS

## 2024-02-04 MED ORDER — DILTIAZEM HCL 25 MG/5ML IV SOLN: 10.0000 mg | INTRAVENOUS | Status: DC | PRN

## 2024-02-05 ENCOUNTER — Other Ambulatory Visit (HOSPITAL_BASED_OUTPATIENT_CLINIC_OR_DEPARTMENT_OTHER): Payer: Self-pay | Admitting: Cardiology

## 2024-02-05 ENCOUNTER — Ambulatory Visit (HOSPITAL_COMMUNITY)
Admission: RE | Admit: 2024-02-05 | Discharge: 2024-02-05 | Disposition: A | Source: Ambulatory Visit | Attending: Cardiology

## 2024-02-05 DIAGNOSIS — R931 Abnormal findings on diagnostic imaging of heart and coronary circulation: Secondary | ICD-10-CM

## 2024-02-08 ENCOUNTER — Ambulatory Visit: Payer: Self-pay | Admitting: Physician Assistant

## 2024-02-08 ENCOUNTER — Other Ambulatory Visit: Payer: Self-pay | Admitting: Physician Assistant

## 2024-02-08 DIAGNOSIS — E785 Hyperlipidemia, unspecified: Secondary | ICD-10-CM

## 2024-02-08 MED ORDER — ASPIRIN 81 MG PO TBEC: 81.0000 mg | DELAYED_RELEASE_TABLET | Freq: Every day | ORAL | Status: AC

## 2024-02-08 MED ORDER — ATORVASTATIN CALCIUM 20 MG PO TABS: 20.0000 mg | ORAL_TABLET | Freq: Every day | ORAL | 1 refills | Status: AC

## 2024-02-09 ENCOUNTER — Other Ambulatory Visit: Payer: Self-pay

## 2024-02-09 DIAGNOSIS — E785 Hyperlipidemia, unspecified: Secondary | ICD-10-CM
